# Patient Record
Sex: Male | Born: 1985 | Race: Black or African American | Hispanic: No | Marital: Single | Smoking: Current every day smoker
Health system: Southern US, Community
[De-identification: ages and names within clinical notes are randomized; demographics above are authoritative.]

## PROBLEM LIST (undated history)

## (undated) DIAGNOSIS — R7303 Prediabetes: Secondary | ICD-10-CM

## (undated) DIAGNOSIS — F1910 Other psychoactive substance abuse, uncomplicated: Secondary | ICD-10-CM

## (undated) DIAGNOSIS — F329 Major depressive disorder, single episode, unspecified: Secondary | ICD-10-CM

## (undated) DIAGNOSIS — T1491XA Suicide attempt, initial encounter: Secondary | ICD-10-CM

## (undated) DIAGNOSIS — F1490 Cocaine use, unspecified, uncomplicated: Secondary | ICD-10-CM

## (undated) DIAGNOSIS — F1190 Opioid use, unspecified, uncomplicated: Secondary | ICD-10-CM

## (undated) DIAGNOSIS — F32A Depression, unspecified: Secondary | ICD-10-CM

## (undated) HISTORY — PX: OTHER SURGICAL HISTORY: SHX169

---

## 2010-06-01 ENCOUNTER — Emergency Department (HOSPITAL_COMMUNITY)
Admission: EM | Admit: 2010-06-01 | Discharge: 2010-06-01 | Disposition: A | Discharge status: Home / Self Care | Payer: Self-pay | Attending: Emergency Medicine | Admitting: Emergency Medicine

## 2010-06-01 ENCOUNTER — Emergency Department (HOSPITAL_COMMUNITY): Payer: Self-pay

## 2010-06-01 DIAGNOSIS — R296 Repeated falls: Secondary | ICD-10-CM | POA: Insufficient documentation

## 2010-06-01 DIAGNOSIS — Y92009 Unspecified place in unspecified non-institutional (private) residence as the place of occurrence of the external cause: Secondary | ICD-10-CM | POA: Insufficient documentation

## 2010-06-01 DIAGNOSIS — S62109A Fracture of unspecified carpal bone, unspecified wrist, initial encounter for closed fracture: Secondary | ICD-10-CM | POA: Insufficient documentation

## 2010-06-01 DIAGNOSIS — M7989 Other specified soft tissue disorders: Secondary | ICD-10-CM | POA: Insufficient documentation

## 2010-06-01 DIAGNOSIS — M25539 Pain in unspecified wrist: Secondary | ICD-10-CM | POA: Insufficient documentation

## 2010-11-09 ENCOUNTER — Emergency Department (HOSPITAL_COMMUNITY)
Admission: EM | Admit: 2010-11-09 | Discharge: 2010-11-09 | Disposition: A | Discharge status: Home / Self Care | Payer: Self-pay | Attending: Emergency Medicine | Admitting: Emergency Medicine

## 2010-11-09 DIAGNOSIS — M25539 Pain in unspecified wrist: Secondary | ICD-10-CM | POA: Insufficient documentation

## 2010-11-09 DIAGNOSIS — R51 Headache: Secondary | ICD-10-CM | POA: Insufficient documentation

## 2012-01-19 IMAGING — CR DG HAND COMPLETE 3+V*L*
3 series · 3 of 3 positions shown · non-contrast
Comparison: None.

CLINICAL DATA: Fall.  Lateral wrist and hand pain.

LEFT HAND - COMPLETE 3+ VIEW

[x hand pa left]
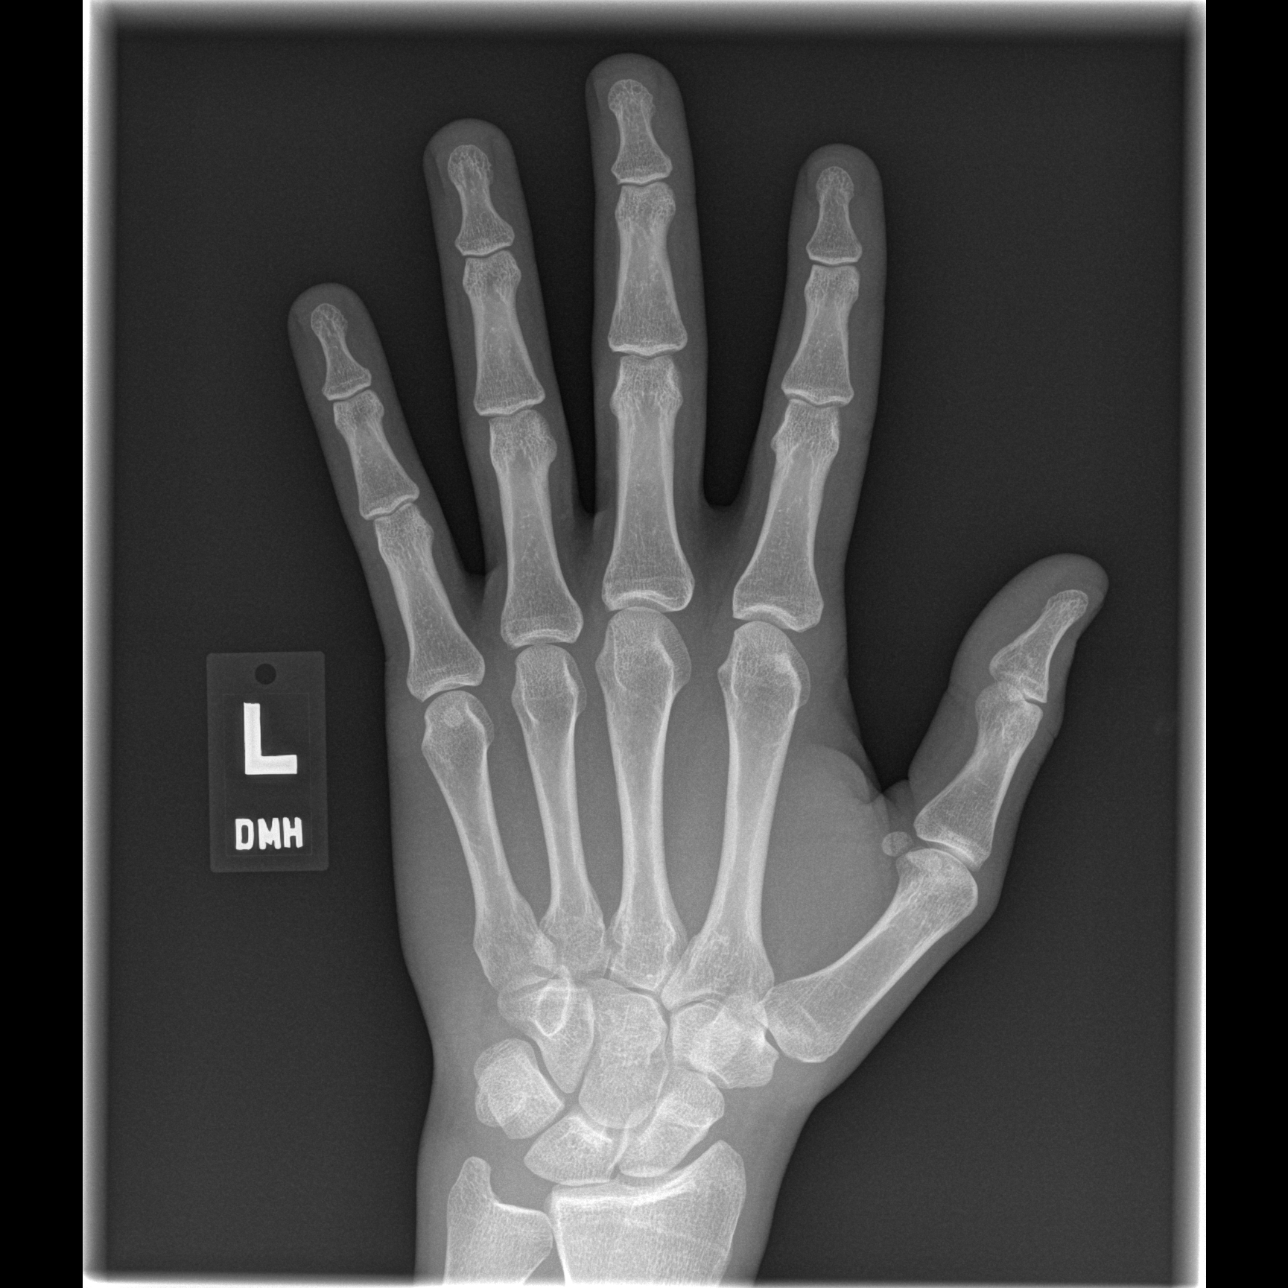

[x hand oblique left]
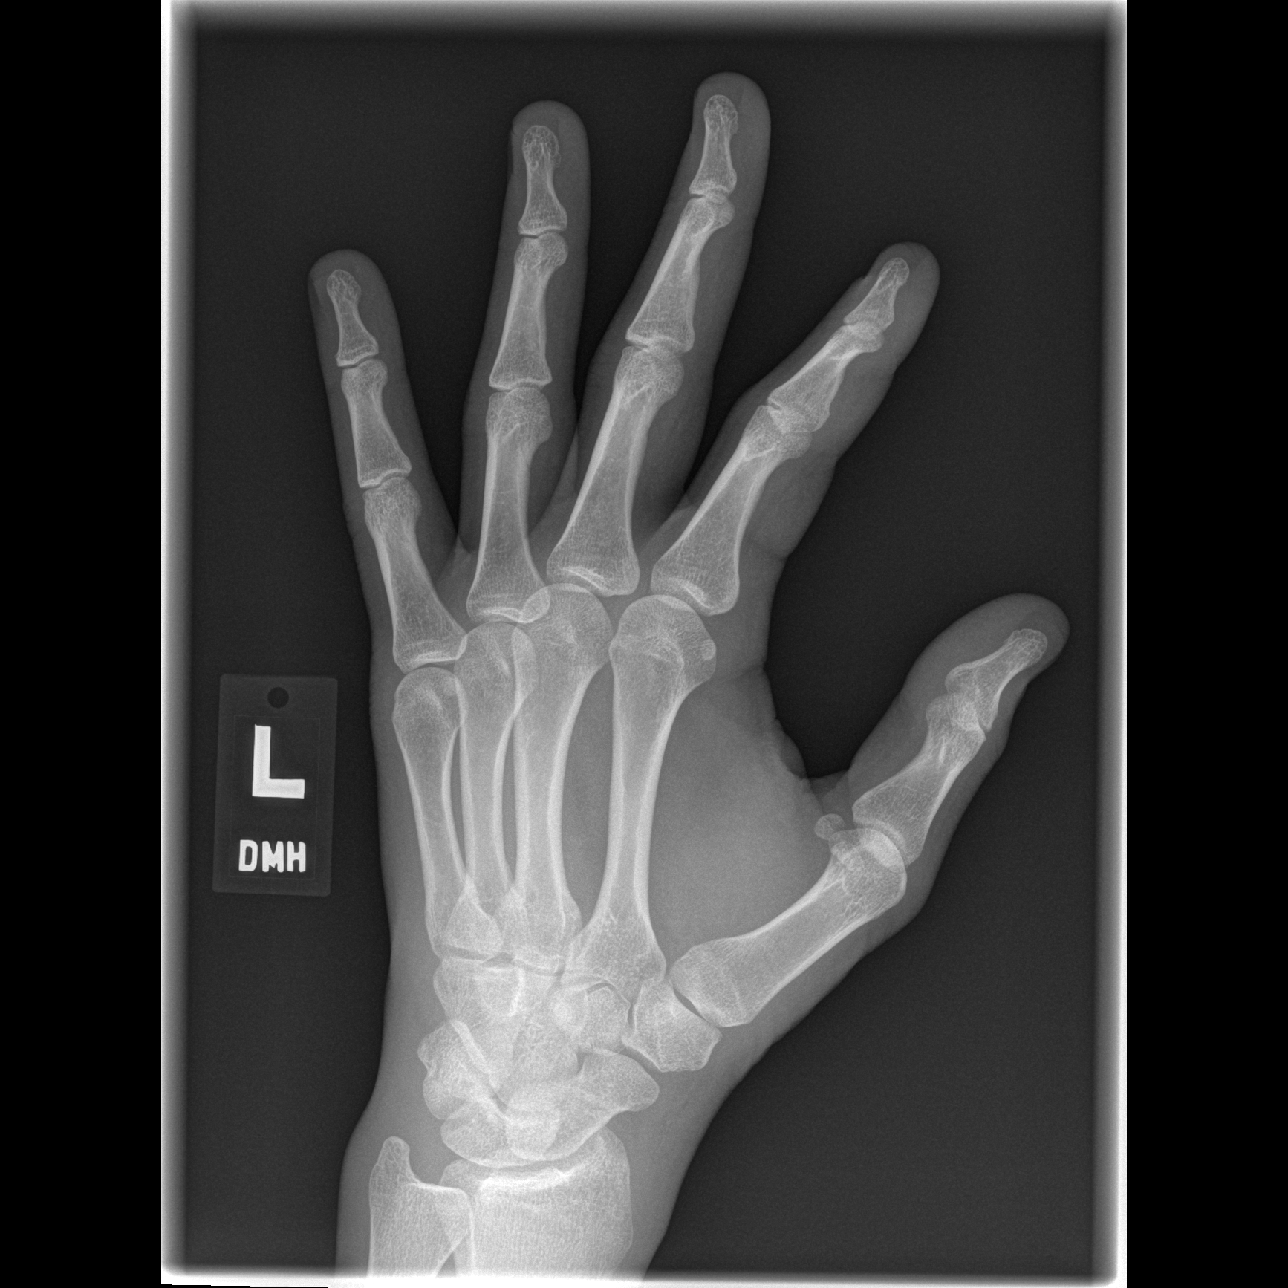

[x hand lat left]
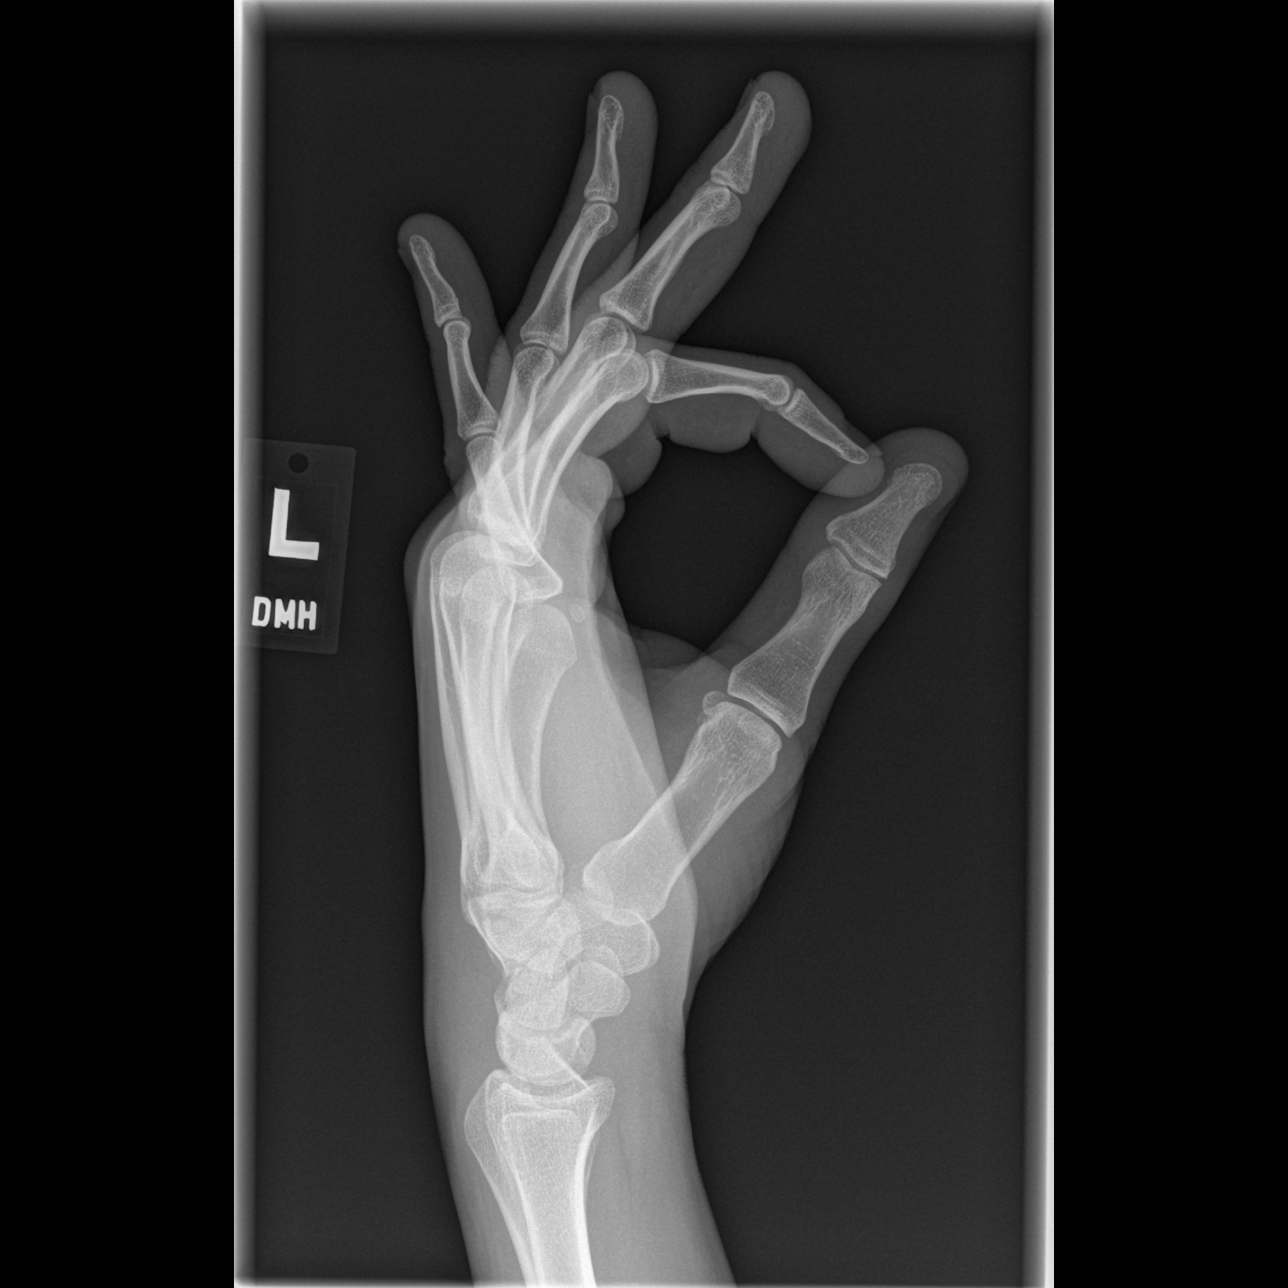

[3 of 3 positions shown; findings below may reference images not displayed]

FINDINGS: There is no evidence of fracture or dislocation.  There
is no evidence of arthropathy or other focal bone abnormality.
Soft tissues are unremarkable.
IMPRESSION: Negative.

## 2012-12-09 ENCOUNTER — Encounter (HOSPITAL_COMMUNITY): Payer: Self-pay | Admitting: Emergency Medicine

## 2012-12-09 ENCOUNTER — Emergency Department (HOSPITAL_COMMUNITY)
Admission: EM | Admit: 2012-12-09 | Discharge: 2012-12-10 | Disposition: A | Discharge status: Home / Self Care | Payer: No Typology Code available for payment source | Attending: Emergency Medicine | Admitting: Emergency Medicine

## 2012-12-09 DIAGNOSIS — F17200 Nicotine dependence, unspecified, uncomplicated: Secondary | ICD-10-CM | POA: Insufficient documentation

## 2012-12-09 DIAGNOSIS — T148XXA Other injury of unspecified body region, initial encounter: Secondary | ICD-10-CM

## 2012-12-09 DIAGNOSIS — S335XXA Sprain of ligaments of lumbar spine, initial encounter: Secondary | ICD-10-CM | POA: Insufficient documentation

## 2012-12-09 DIAGNOSIS — Y92410 Unspecified street and highway as the place of occurrence of the external cause: Secondary | ICD-10-CM | POA: Insufficient documentation

## 2012-12-09 DIAGNOSIS — Y9389 Activity, other specified: Secondary | ICD-10-CM | POA: Insufficient documentation

## 2012-12-09 DIAGNOSIS — S139XXA Sprain of joints and ligaments of unspecified parts of neck, initial encounter: Secondary | ICD-10-CM | POA: Insufficient documentation
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
## 2012-12-10 ENCOUNTER — Emergency Department (HOSPITAL_COMMUNITY): Payer: No Typology Code available for payment source

## 2012-12-10 MED ORDER — HYDROCODONE-ACETAMINOPHEN 5-325 MG PO TABS
1.0000 | ORAL_TABLET | Freq: Once | ORAL | Status: AC
  Administered 2012-12-10: 1 via ORAL
  Filled 2012-12-10: qty 1

## 2012-12-10 MED ORDER — NAPROXEN 500 MG PO TABS: 500.0000 mg | ORAL_TABLET | Freq: Two times a day (BID) | ORAL | Status: DC

## 2012-12-10 MED ORDER — CYCLOBENZAPRINE HCL 10 MG PO TABS: 10.0000 mg | ORAL_TABLET | Freq: Three times a day (TID) | ORAL | Status: DC | PRN
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
## 2012-12-10 NOTE — ED Provider Notes (Signed)
Medical screening examination/treatment/procedure(s) were performed by non-physician practitioner and as supervising physician I was immediately available for consultation/collaboration.  Inri Sobieski M Alazay Leicht, MD 12/10/12 0446 

## 2012-12-10 NOTE — ED Notes (Signed)
Pt denies any questions upon discharge, report decrease pain from original pain level.

## 2015-06-05 ENCOUNTER — Inpatient Hospital Stay (HOSPITAL_COMMUNITY)
Admission: EM | Admit: 2015-06-05 | Discharge: 2015-06-08 | DRG: 917 | Disposition: A | Discharge status: Home / Self Care | Payer: Self-pay | Attending: Internal Medicine | Admitting: Internal Medicine

## 2015-06-05 ENCOUNTER — Emergency Department (HOSPITAL_COMMUNITY): Payer: Self-pay

## 2015-06-05 ENCOUNTER — Encounter (HOSPITAL_COMMUNITY): Payer: Self-pay

## 2015-06-05 DIAGNOSIS — F17200 Nicotine dependence, unspecified, uncomplicated: Secondary | ICD-10-CM | POA: Diagnosis present

## 2015-06-05 DIAGNOSIS — Z6821 Body mass index (BMI) 21.0-21.9, adult: Secondary | ICD-10-CM

## 2015-06-05 DIAGNOSIS — Z8249 Family history of ischemic heart disease and other diseases of the circulatory system: Secondary | ICD-10-CM

## 2015-06-05 DIAGNOSIS — Z791 Long term (current) use of non-steroidal anti-inflammatories (NSAID): Secondary | ICD-10-CM

## 2015-06-05 DIAGNOSIS — T50901A Poisoning by unspecified drugs, medicaments and biological substances, accidental (unintentional), initial encounter: Secondary | ICD-10-CM | POA: Diagnosis present

## 2015-06-05 DIAGNOSIS — E1165 Type 2 diabetes mellitus with hyperglycemia: Secondary | ICD-10-CM | POA: Diagnosis present

## 2015-06-05 DIAGNOSIS — E872 Acidosis: Secondary | ICD-10-CM | POA: Diagnosis present

## 2015-06-05 DIAGNOSIS — J690 Pneumonitis due to inhalation of food and vomit: Secondary | ICD-10-CM | POA: Diagnosis present

## 2015-06-05 DIAGNOSIS — E860 Dehydration: Secondary | ICD-10-CM | POA: Diagnosis present

## 2015-06-05 DIAGNOSIS — F1910 Other psychoactive substance abuse, uncomplicated: Secondary | ICD-10-CM | POA: Diagnosis present

## 2015-06-05 DIAGNOSIS — R7303 Prediabetes: Secondary | ICD-10-CM

## 2015-06-05 DIAGNOSIS — T402X1A Poisoning by other opioids, accidental (unintentional), initial encounter: Principal | ICD-10-CM | POA: Diagnosis present

## 2015-06-05 DIAGNOSIS — J9601 Acute respiratory failure with hypoxia: Secondary | ICD-10-CM | POA: Diagnosis present

## 2015-06-05 DIAGNOSIS — N179 Acute kidney failure, unspecified: Secondary | ICD-10-CM | POA: Diagnosis present

## 2015-06-05 LAB — BLOOD GAS, ARTERIAL
Acid-base deficit: 5.7 mmol/L — ABNORMAL HIGH (ref 0.0–2.0)
BICARBONATE: 21.3 meq/L (ref 20.0–24.0)
Drawn by: 252031
FIO2: 1
O2 Saturation: 89.6 %
PO2 ART: 63.8 mmHg — AB (ref 80.0–100.0)
Patient temperature: 98.6
TCO2: 19.9 mmol/L (ref 0–100)
pCO2 arterial: 50.2 mmHg — ABNORMAL HIGH (ref 35.0–45.0)
pH, Arterial: 7.251 — ABNORMAL LOW (ref 7.350–7.450)

## 2015-06-05 LAB — CBC WITH DIFFERENTIAL/PLATELET
Basophils Absolute: 0 10*3/uL (ref 0.0–0.1)
Basophils Relative: 0 %
EOS PCT: 1 %
Eosinophils Absolute: 0 10*3/uL (ref 0.0–0.7)
HCT: 38.9 % — ABNORMAL LOW (ref 39.0–52.0)
Hemoglobin: 13.4 g/dL (ref 13.0–17.0)
LYMPHS ABS: 0.7 10*3/uL (ref 0.7–4.0)
LYMPHS PCT: 28 %
MCH: 32.4 pg (ref 26.0–34.0)
MCHC: 34.4 g/dL (ref 30.0–36.0)
MCV: 94 fL (ref 78.0–100.0)
MONO ABS: 0.3 10*3/uL (ref 0.1–1.0)
Monocytes Relative: 11 %
Neutro Abs: 1.5 10*3/uL — ABNORMAL LOW (ref 1.7–7.7)
Neutrophils Relative %: 60 %
PLATELETS: 158 10*3/uL (ref 150–400)
RBC: 4.14 MIL/uL — AB (ref 4.22–5.81)
RDW: 12.1 % (ref 11.5–15.5)
WBC: 2.5 10*3/uL — ABNORMAL LOW (ref 4.0–10.5)

## 2015-06-05 LAB — I-STAT CG4 LACTIC ACID, ED: Lactic Acid, Venous: 2.17 mmol/L (ref 0.5–2.0)

## 2015-06-05 LAB — COMPREHENSIVE METABOLIC PANEL
ALT: 31 U/L (ref 17–63)
AST: 46 U/L — ABNORMAL HIGH (ref 15–41)
Albumin: 3.8 g/dL (ref 3.5–5.0)
Alkaline Phosphatase: 81 U/L (ref 38–126)
Anion gap: 17 — ABNORMAL HIGH (ref 5–15)
BUN: 26 mg/dL — ABNORMAL HIGH (ref 6–20)
CHLORIDE: 109 mmol/L (ref 101–111)
CO2: 14 mmol/L — ABNORMAL LOW (ref 22–32)
CREATININE: 1.79 mg/dL — AB (ref 0.61–1.24)
Calcium: 9.2 mg/dL (ref 8.9–10.3)
GFR, EST AFRICAN AMERICAN: 57 mL/min — AB (ref >60)
GFR, EST NON AFRICAN AMERICAN: 49 mL/min — AB (ref >60)
Glucose, Bld: 219 mg/dL — ABNORMAL HIGH (ref 65–99)
POTASSIUM: 3.6 mmol/L (ref 3.5–5.1)
Sodium: 140 mmol/L (ref 135–145)
Total Bilirubin: 0.5 mg/dL (ref 0.3–1.2)
Total Protein: 6.4 g/dL — ABNORMAL LOW (ref 6.5–8.1)

## 2015-06-05 LAB — RAPID URINE DRUG SCREEN, HOSP PERFORMED
AMPHETAMINES: NOT DETECTED
BARBITURATES: NOT DETECTED
BENZODIAZEPINES: NOT DETECTED
COCAINE: NOT DETECTED
Opiates: POSITIVE — AB
TETRAHYDROCANNABINOL: POSITIVE — AB

## 2015-06-05 LAB — ACETAMINOPHEN LEVEL

## 2015-06-05 LAB — SALICYLATE LEVEL

## 2015-06-05 MED ORDER — SODIUM CHLORIDE 0.9 % IV SOLN
INTRAVENOUS | Status: DC
  Administered 2015-06-05: 23:00:00 via INTRAVENOUS

## 2015-06-05 MED ORDER — IPRATROPIUM BROMIDE 0.02 % IN SOLN
0.5000 mg | Freq: Once | RESPIRATORY_TRACT | Status: AC
  Administered 2015-06-05: 0.5 mg via RESPIRATORY_TRACT
  Filled 2015-06-05: qty 2.5

## 2015-06-05 MED ORDER — SODIUM CHLORIDE 0.9 % IV SOLN
3.0000 g | Freq: Once | INTRAVENOUS | Status: AC
  Administered 2015-06-05: 3 g via INTRAVENOUS
  Filled 2015-06-05: qty 3

## 2015-06-05 MED ORDER — SODIUM CHLORIDE 0.9 % IV BOLUS (SEPSIS)
3000.0000 mL | Freq: Once | INTRAVENOUS | Status: AC
  Administered 2015-06-05: 3000 mL via INTRAVENOUS

## 2015-06-05 MED ORDER — SODIUM CHLORIDE 0.9 % IV SOLN
INTRAVENOUS | Status: DC
  Administered 2015-06-05: 22:00:00 via INTRAVENOUS

## 2015-06-05 MED ORDER — ALBUTEROL SULFATE (2.5 MG/3ML) 0.083% IN NEBU
5.0000 mg | INHALATION_SOLUTION | Freq: Once | RESPIRATORY_TRACT | Status: AC
  Administered 2015-06-05: 5 mg via RESPIRATORY_TRACT
  Filled 2015-06-05: qty 6

## 2015-06-05 NOTE — ED Notes (Signed)
Pt mother and adopted mother at the bedside and updated on the same. Pt mother with concerns the pt has been snorting cocaine and heroin.

## 2015-06-05 NOTE — ED Notes (Signed)
Notified EDP,Knott,MD., pt. i-stat CBG Lactic acid 2.17 and RN,Kelly made aware.

## 2015-06-05 NOTE — ED Notes (Signed)
Patient transported by [facility] from home.  Patient was "snorting something" and was last seen normal today at 1400.  Patient found by family with snoring respirations at 1700 today.  Patient was given Narcan  intranasal upon fire dept arrival.  Patient was given Narcan  IM by EMS.  Patient awake, uncooperative upon arrival to ED.

## 2015-06-05 NOTE — ED Notes (Signed)
Pt.CBG 297. RN,Kelly made aware and EDP,Allen,MD.

## 2015-06-05 NOTE — Progress Notes (Signed)
Per MD, hold off on BiPAP at this time.  Pt calming down and answering more appropriately.  HHN treatment given for wheezing.  Pt to be monitored closely and placed on BiPAP if needed.  RN aware.  RT will continue to monitor as needed.

## 2015-06-05 NOTE — ED Notes (Signed)
Pt. Wiggled and squirmed until IV came out. RN,Ericka made aware.
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
## 2015-06-06 ENCOUNTER — Inpatient Hospital Stay (HOSPITAL_COMMUNITY): Payer: Self-pay

## 2015-06-06 ENCOUNTER — Encounter (HOSPITAL_COMMUNITY): Payer: Self-pay | Admitting: Internal Medicine

## 2015-06-06 DIAGNOSIS — E1129 Type 2 diabetes mellitus with other diabetic kidney complication: Secondary | ICD-10-CM

## 2015-06-06 DIAGNOSIS — N179 Acute kidney failure, unspecified: Secondary | ICD-10-CM | POA: Diagnosis present

## 2015-06-06 DIAGNOSIS — R7303 Prediabetes: Secondary | ICD-10-CM | POA: Diagnosis present

## 2015-06-06 DIAGNOSIS — F1910 Other psychoactive substance abuse, uncomplicated: Secondary | ICD-10-CM | POA: Diagnosis present

## 2015-06-06 DIAGNOSIS — T50901A Poisoning by unspecified drugs, medicaments and biological substances, accidental (unintentional), initial encounter: Secondary | ICD-10-CM | POA: Diagnosis present

## 2015-06-06 DIAGNOSIS — T402X1A Poisoning by other opioids, accidental (unintentional), initial encounter: Secondary | ICD-10-CM | POA: Diagnosis present

## 2015-06-06 LAB — COMPREHENSIVE METABOLIC PANEL
ALK PHOS: 44 U/L (ref 38–126)
ALT: 33 U/L (ref 17–63)
AST: 68 U/L — AB (ref 15–41)
Albumin: 3.2 g/dL — ABNORMAL LOW (ref 3.5–5.0)
Anion gap: 24 — ABNORMAL HIGH (ref 5–15)
BILIRUBIN TOTAL: 0.5 mg/dL (ref 0.3–1.2)
BUN: 19 mg/dL (ref 6–20)
CALCIUM: 7 mg/dL — AB (ref 8.9–10.3)
CHLORIDE: 96 mmol/L — AB (ref 101–111)
CO2: 19 mmol/L — ABNORMAL LOW (ref 22–32)
CREATININE: 1.19 mg/dL (ref 0.61–1.24)
Glucose, Bld: 136 mg/dL — ABNORMAL HIGH (ref 65–99)
Potassium: 3.6 mmol/L (ref 3.5–5.1)
Sodium: 139 mmol/L (ref 135–145)
Total Protein: 5.7 g/dL — ABNORMAL LOW (ref 6.5–8.1)

## 2015-06-06 LAB — CBG MONITORING, ED
Glucose-Capillary: 111 mg/dL — ABNORMAL HIGH (ref 65–99)
Glucose-Capillary: 94 mg/dL (ref 65–99)
Glucose-Capillary: 97 mg/dL (ref 65–99)

## 2015-06-06 LAB — CBC WITH DIFFERENTIAL/PLATELET
BASOS PCT: 0 %
Basophils Absolute: 0 10*3/uL (ref 0.0–0.1)
EOS ABS: 0 10*3/uL (ref 0.0–0.7)
EOS PCT: 0 %
HCT: 37 % — ABNORMAL LOW (ref 39.0–52.0)
Hemoglobin: 12.6 g/dL — ABNORMAL LOW (ref 13.0–17.0)
LYMPHS ABS: 0.7 10*3/uL (ref 0.7–4.0)
Lymphocytes Relative: 16 %
MCH: 32.1 pg (ref 26.0–34.0)
MCHC: 34.1 g/dL (ref 30.0–36.0)
MCV: 94.1 fL (ref 78.0–100.0)
Monocytes Absolute: 0.3 10*3/uL (ref 0.1–1.0)
Monocytes Relative: 8 %
NEUTROS PCT: 76 %
Neutro Abs: 3.3 10*3/uL (ref 1.7–7.7)
PLATELETS: 127 10*3/uL — AB (ref 150–400)
RBC: 3.93 MIL/uL — AB (ref 4.22–5.81)
RDW: 12.2 % (ref 11.5–15.5)
WBC: 4.4 10*3/uL (ref 4.0–10.5)

## 2015-06-06 LAB — STREP PNEUMONIAE URINARY ANTIGEN: STREP PNEUMO URINARY ANTIGEN: NEGATIVE

## 2015-06-06 LAB — GLUCOSE, CAPILLARY
GLUCOSE-CAPILLARY: 100 mg/dL — AB (ref 65–99)
GLUCOSE-CAPILLARY: 95 mg/dL (ref 65–99)
GLUCOSE-CAPILLARY: 99 mg/dL (ref 65–99)

## 2015-06-06 LAB — SODIUM, URINE, RANDOM: Sodium, Ur: 39 mmol/L

## 2015-06-06 LAB — HIV ANTIBODY (ROUTINE TESTING W REFLEX): HIV SCREEN 4TH GENERATION: NONREACTIVE

## 2015-06-06 LAB — BRAIN NATRIURETIC PEPTIDE: B Natriuretic Peptide: 59 pg/mL (ref 0.0–100.0)

## 2015-06-06 LAB — CREATININE, URINE, RANDOM: CREATININE, URINE: 219.4 mg/dL

## 2015-06-06 MED ORDER — INSULIN ASPART 100 UNIT/ML ~~LOC~~ SOLN: 0.0000 [IU] | SUBCUTANEOUS | Status: DC

## 2015-06-06 MED ORDER — SODIUM CHLORIDE 0.9 % IV SOLN: INTRAVENOUS | Status: DC

## 2015-06-06 MED ORDER — THIAMINE HCL 100 MG/ML IJ SOLN
Freq: Once | INTRAVENOUS | Status: AC
  Administered 2015-06-06: 04:00:00 via INTRAVENOUS
  Filled 2015-06-06: qty 1000

## 2015-06-06 MED ORDER — PIPERACILLIN-TAZOBACTAM 3.375 G IVPB
3.3750 g | Freq: Three times a day (TID) | INTRAVENOUS | Status: DC
  Administered 2015-06-06: 3.375 g via INTRAVENOUS
  Filled 2015-06-06: qty 50

## 2015-06-06 MED ORDER — DICYCLOMINE HCL 20 MG PO TABS: 20.0000 mg | ORAL_TABLET | Freq: Four times a day (QID) | ORAL | Status: DC | PRN

## 2015-06-06 MED ORDER — ALBUTEROL SULFATE (2.5 MG/3ML) 0.083% IN NEBU
5.0000 mg | INHALATION_SOLUTION | RESPIRATORY_TRACT | Status: DC | PRN
  Administered 2015-06-06: 5 mg via RESPIRATORY_TRACT
  Filled 2015-06-06: qty 6

## 2015-06-06 MED ORDER — SODIUM CHLORIDE 0.9 % IV SOLN
3.0000 g | Freq: Four times a day (QID) | INTRAVENOUS | Status: DC
  Administered 2015-06-06 – 2015-06-08 (×9): 3 g via INTRAVENOUS
  Filled 2015-06-06 (×9): qty 3

## 2015-06-06 MED ORDER — KCL IN DEXTROSE-NACL 20-5-0.45 MEQ/L-%-% IV SOLN: INTRAVENOUS | Status: DC

## 2015-06-06 MED ORDER — NALOXONE HCL 0.4 MG/ML IJ SOLN: 0.4000 mg | INTRAMUSCULAR | Status: DC | PRN

## 2015-06-06 MED ORDER — LORAZEPAM 2 MG/ML IJ SOLN: 2.0000 mg | INTRAMUSCULAR | Status: DC | PRN

## 2015-06-06 MED ORDER — IPRATROPIUM BROMIDE 0.02 % IN SOLN
0.5000 mg | Freq: Four times a day (QID) | RESPIRATORY_TRACT | Status: DC
  Administered 2015-06-06: 0.5 mg via RESPIRATORY_TRACT
  Filled 2015-06-06: qty 2.5

## 2015-06-06 MED ORDER — NAPROXEN 500 MG PO TABS
500.0000 mg | ORAL_TABLET | Freq: Two times a day (BID) | ORAL | Status: DC | PRN
  Filled 2015-06-06: qty 1

## 2015-06-06 MED ORDER — LOPERAMIDE HCL 2 MG PO CAPS: 2.0000 mg | ORAL_CAPSULE | ORAL | Status: DC | PRN

## 2015-06-06 MED ORDER — METHOCARBAMOL 500 MG PO TABS: 500.0000 mg | ORAL_TABLET | Freq: Three times a day (TID) | ORAL | Status: DC | PRN

## 2015-06-06 MED ORDER — ONDANSETRON 4 MG PO TBDP: 4.0000 mg | ORAL_TABLET | Freq: Four times a day (QID) | ORAL | Status: DC | PRN

## 2015-06-06 MED ORDER — IPRATROPIUM BROMIDE 0.02 % IN SOLN: 0.5000 mg | Freq: Four times a day (QID) | RESPIRATORY_TRACT | Status: DC | PRN

## 2015-06-06 MED ORDER — ENOXAPARIN SODIUM 40 MG/0.4ML ~~LOC~~ SOLN
40.0000 mg | SUBCUTANEOUS | Status: DC
  Administered 2015-06-06 – 2015-06-08 (×3): 40 mg via SUBCUTANEOUS
  Filled 2015-06-06 (×3): qty 0.4

## 2015-06-06 MED ORDER — HYDROXYZINE HCL 25 MG PO TABS: 25.0000 mg | ORAL_TABLET | Freq: Four times a day (QID) | ORAL | Status: DC | PRN

## 2015-06-06 NOTE — Progress Notes (Signed)
Pharmacy Antibiotic Note  Geoffrey Flowers is a 30 y.o. male admitted on 06/05/2015 with aspiration PNA.Marland Kitchen  Pharmacy has been consulted for Unasyn dosing.  Plan: Pt received a one time dose of Unasyn 3 gr. Will start Unasyn 3 gr IV q6h  Height:  (177.8 cm) Weight: 150 lb (68.04 kg) IBW/kg (Calculated) : 73  Temp (24hrs), Avg:98.1 F (36.7 C), Min:97.2 F (36.2 C), Max:98.7 F (37.1 C)   Recent Labs Lab 06/05/15 2123 06/05/15 2321 06/05/15 2337 06/06/15 0424  WBC  --  2.5*  --  4.4  CREATININE 1.79*  --   --  1.19  LATICACIDVEN  --   --  2.17*  --     Estimated Creatinine Clearance: 87.3 mL/min (by C-G formula based on Cr of 1.19).    No Known Allergies  Antimicrobials this admission: Unasyn 2/10 >>  Zosyn 2/11 >> 2/11  Dose adjustments this admission:   Microbiology results: None  Thank you for allowing pharmacy to be a part of this patient's care.   Adalberto Cole, PharmD, BCPS Pager [phone] 06/06/2015 10:27 AM

## 2015-06-06 NOTE — ED Notes (Signed)
Pt arouseable and answering questions.  Pt disoriented as to events last night.  Explained plan to patient.
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
## 2015-06-06 NOTE — ED Notes (Signed)
Dr. Freida Busman notified of pt current vitals, aware pt is now on a non rebreather at this time. Still remains agitated, pulling at lines and monitor, frequent reorientation. Pt will not follow commands, or answer questions, only ask for water. Xray notified to please complete cxray at this time.

## 2015-06-06 NOTE — ED Notes (Signed)
Admitting md at bedside for assessment

## 2015-06-06 NOTE — H&P (Signed)
PCP: None   Referring provider Freida Busman   Chief Complaint: Brought in by EMS opiate overdose  HPI: Geoffrey Flowers is a 30 y.o. male   has no past medical history on file.   Presented from home patient was found by family with snoring or respirations and pinpoint pupils at 5 PM he was last seen at 2 PM snorting something. His blood sugar on arrival was noted to be 400 Patient was given intranasal Narcan 2 mg by fire department and then 2 mg IM by EMS patient woke up but became belligerent and combative.  IN ER: On arrival CBG noted to be elevated at 296. Chest x-ray showed pulmonary edema but ER provider felt a small likely aspiration pneumonia and antibiotic coverage was started. ABG was obtained showing pH of 7.251/15.2/63.8 Was initially currently patient is much more alert. Creatinine was noted to be elevated at 1.79 and he was given IV fluids for presumed acute renal failure patient was found to have some wheezing and started on a beautiful and Atrovent in emergency department. Urine tox positive for marijuana and opiates   Hospitalist was called for admission for presumed aspiration pneumonia in the setting of heroin overdose  Review of Systems:   Patient refuses to answer unable to obtain   Past Medical History: History reviewed. No pertinent past medical history. History reviewed. No pertinent past surgical history.   Medications: Prior to Admission medications   Medication Sig Start Date End Date Taking? Authorizing Provider  cyclobenzaprine (FLEXERIL) 10 MG tablet Take 1 tablet (10 mg total) by mouth 3 (three) times daily as needed for muscle spasms. 12/10/12   Ivonne Andrew, PA-C  naproxen (NAPROSYN) 500 MG tablet Take 1 tablet (500 mg total) by mouth 2 (two) times daily. 12/10/12   Ivonne Andrew, PA-C    Allergies:  No Known Allergies  Social History:  Ambulatory  independently Lives at home With family issues and has adoptive mother as well as biological mother    Per nursing staff family reported that patient abuses drugs together his biological mother   reports that he has been smoking.  He does not have any smokeless tobacco history on file. He reports that he drinks alcohol. He reports that he uses illicit drugs (Heroin).    Family History: family history includes Hypertension in his mother.  as per patient's report 1 his parents have hypertension   Physical Exam: Patient Vitals for the past 24 hrs:  BP Temp Temp src Pulse Resp SpO2  06/05/15 2345 - - - 108 19 96 %  06/05/15 2328 - 98.7 F (37.1 C) - - - -  06/05/15 2303 - - - - - 96 %  06/05/15 2300 105/61 mmHg - - 107 (!) 30 95 %  06/05/15 2230 144/92 mmHg - - (!) 135 22 (!) 88 %  06/05/15 2200 98/85 mmHg - - 119 22 (!) 86 %  06/05/15 2154 115/90 mmHg - - 116 19 (!) 89 %  06/05/15 2140 106/70 mmHg - - (!) 142 26 -  06/05/15 2135 100/64 mmHg - - (!) 151 - -  06/05/15 2020 145/95 mmHg 97.2 F (36.2 C) Rectal (!) 145 18 100 %    1. General:  in No Acute distress 2. Psychological: Somnolent but arousable and Oriented to self 3. Head/ENT:   Dry Mucous Membranes                          Head Non  traumatic, neck supple                          Normal Dentition 4. SKIN:   decreased Skin turgor,  Skin clean Dry and intact no rash multiple tattoos present needle marks in the left AC noted 5. Heart: Regular rate and rhythm no Murmur, Rub or gallop 6. Lungs:  no wheezes some  crackles  Noted  7. Abdomen: Soft, non-tender, Non distended 8. Lower extremities: no clubbing, cyanosis, or edema 9. Neurologically Grossly intact, moving all 4 extremities equally 10. MSK: Normal range of motion  body mass index is unknown because there is no height or weight on file.   Labs on Admission:   Results for orders placed or performed during the hospital encounter of 06/05/15 (from the past 24 hour(s))  Comprehensive metabolic panel     Status: Abnormal   Collection Time: 06/05/15  9:23 PM  Result  Value Ref Range   Sodium 140 135 - 145 mmol/L   Potassium 3.6 3.5 - 5.1 mmol/L   Chloride 109 101 - 111 mmol/L   CO2 14 (L) 22 - 32 mmol/L   Glucose, Bld 219 (H) 65 - 99 mg/dL   BUN 26 (H) 6 - 20 mg/dL   Creatinine, Ser 1.61 (H) 0.61 - 1.24 mg/dL   Calcium 9.2 8.9 - 09.6 mg/dL   Total Protein 6.4 (L) 6.5 - 8.1 g/dL   Albumin 3.8 3.5 - 5.0 g/dL   AST 46 (H) 15 - 41 U/L   ALT 31 17 - 63 U/L   Alkaline Phosphatase 81 38 - 126 U/L   Total Bilirubin 0.5 0.3 - 1.2 mg/dL   GFR calc non Af Amer 49 (L) >60 mL/min   GFR calc Af Amer 57 (L) >60 mL/min   Anion gap 17 (H) 5 - 15  Salicylate level     Status: None   Collection Time: 06/05/15  9:23 PM  Result Value Ref Range   Salicylate Lvl <4.0 2.8 - 30.0 mg/dL  Acetaminophen level     Status: Abnormal   Collection Time: 06/05/15  9:23 PM  Result Value Ref Range   Acetaminophen (Tylenol), Serum <10 (L) 10 - 30 ug/mL  Urine rapid drug screen (hosp performed)     Status: Abnormal   Collection Time: 06/05/15  9:33 PM  Result Value Ref Range   Opiates POSITIVE (A) NONE DETECTED   Cocaine NONE DETECTED NONE DETECTED   Benzodiazepines NONE DETECTED NONE DETECTED   Amphetamines NONE DETECTED NONE DETECTED   Tetrahydrocannabinol POSITIVE (A) NONE DETECTED   Barbiturates NONE DETECTED NONE DETECTED  Blood gas, arterial     Status: Abnormal   Collection Time: 06/05/15 10:37 PM  Result Value Ref Range   FIO2 1.00    Delivery systems OXYGEN MASK    pH, Arterial 7.251 (L) 7.350 - 7.450   pCO2 arterial 50.2 (H) 35.0 - 45.0 mmHg   pO2, Arterial 63.8 (L) 80.0 - 100.0 mmHg   Bicarbonate 21.3 20.0 - 24.0 mEq/L   TCO2 19.9 0 - 100 mmol/L   Acid-base deficit 5.7 (H) 0.0 - 2.0 mmol/L   O2 Saturation 89.6 %   Patient temperature 98.6    Collection site LEFT RADIAL    Drawn by [id number]    Sample type ARTERIAL DRAW    Allens test (pass/fail) PASS PASS  Brain natriuretic peptide     Status: None   Collection Time: 06/05/15 11:21 PM  Result Value Ref  Range   B Natriuretic Peptide 59.0 0.0 - 100.0 pg/mL  CBC with Differential/Platelet     Status: Abnormal   Collection Time: 06/05/15 11:21 PM  Result Value Ref Range   WBC 2.5 (L) 4.0 - 10.5 K/uL   RBC 4.14 (L) 4.22 - 5.81 MIL/uL   Hemoglobin 13.4 13.0 - 17.0 g/dL   HCT 16.1 (L) 09.6 - 04.5 %   MCV 94.0 78.0 - 100.0 fL   MCH 32.4 26.0 - 34.0 pg   MCHC 34.4 30.0 - 36.0 g/dL   RDW 40.9 81.1 - 91.4 %   Platelets 158 150 - 400 K/uL   Neutrophils Relative % 60 %   Neutro Abs 1.5 (L) 1.7 - 7.7 K/uL   Lymphocytes Relative 28 %   Lymphs Abs 0.7 0.7 - 4.0 K/uL   Monocytes Relative 11 %   Monocytes Absolute 0.3 0.1 - 1.0 K/uL   Eosinophils Relative 1 %   Eosinophils Absolute 0.0 0.0 - 0.7 K/uL   Basophils Relative 0 %   Basophils Absolute 0.0 0.0 - 0.1 K/uL  I-Stat CG4 Lactic Acid, ED     Status: Abnormal   Collection Time: 06/05/15 11:37 PM  Result Value Ref Range   Lactic Acid, Venous 2.17 (HH) 0.5 - 2.0 mmol/L   Comment NOTIFIED PHYSICIAN     UAnot obtained  No results found for: HGBA1C  CrCl cannot be calculated (Unknown ideal weight.).  BNP (last 3 results) No results for input(s): PROBNP in the last 8760 hours.  Other results:  I have pearsonaly reviewed this: ECG REPORT Not obtained  There were no vitals filed for this visit.   Cultures: No results found for: SDES, SPECREQUEST, CULT, REPTSTATUS   Radiological Exams on Admission: Dg Chest Port 1 View  06/05/2015  CLINICAL DATA:  Acute onset of snoring respirations. Was snorting something earlier today. Initial encounter. EXAM: PORTABLE CHEST 1 VIEW COMPARISON:  None. FINDINGS: The lungs are well-aerated. Mild right perihilar and diffuse left-sided airspace opacification raise concern for asymmetric flash pulmonary edema. Pneumonia is considered less likely. There is no evidence of pleural effusion or pneumothorax. The cardiomediastinal silhouette is within normal limits. No acute osseous abnormalities are seen.  IMPRESSION: Mild right perihilar and diffuse left-sided airspace opacification raise concern for asymmetric flash pulmonary edema. Pneumonia is considered less likely. Electronically Signed   By: Roanna Raider M.D.   On: 06/05/2015 22:10    Chart has been reviewed  Family not at  Bedside   Assessment/Plan  30 year old gentleman with history of drug abuse presents with opioid Overdose resulting in aspiration pneumonia was found to have hyperglycemia worrisome for new onset diabetes and evidence of renal insufficiency Present on Admission:  . Aspiration pneumonia (HCC) - admit to step down continue oxygen therapy discussed case with [facility] hold we'll monitor through [facility]. Currently not in the respiratory distress initial ABG was worrisome for hypercarbia but currently appears to be more awake we'll monitor carefully and step down  . Substance abuse social work consult, order HIV and hepatitis serologies  . Opioid overdose - Narcan when necessary currently does not require Narcan drip patient has been waking up Acute renal failure - will obtain urine electrolytes give gentle fluids obtain renal ultrasound Diabetes mellitus new diagnosis. Will order diabetic coordinator consult once patient is more alert we'll need to order diabetes education. Order sliding scale check hemoglobin A1c and TSH  Prophylaxis: Lovenox   CODE STATUS:  FULL CODE  Disposition:    To home once workup is complete and patient is stable  Other plan as per orders.  I have spent a total of on this admission  extra time was spent to discuss case with  [facility] Murlean Hark 06/06/2015, 12:57 AM   [facility]  Pager [phone]   after 2 AM please page floor coverage PA If 7AM-7PM, please contact the day team taking care of the patient  [email or web address]  Password [email or web address]

## 2015-06-06 NOTE — ED Notes (Signed)
Pt family contact- pt adoptive mother, lark runk, can be reached at [phone]

## 2015-06-06 NOTE — ED Notes (Signed)
Pt refusing all visitors.  This included uncle and mother Mindi Junker who have come to visit.

## 2015-06-06 NOTE — ED Notes (Signed)
Geoffrey Flowers- [phone]

## 2015-06-06 NOTE — ED Notes (Signed)
Ultrasound tech at bedside for imaging.

## 2015-06-07 DIAGNOSIS — F1910 Other psychoactive substance abuse, uncomplicated: Secondary | ICD-10-CM

## 2015-06-07 DIAGNOSIS — T50901D Poisoning by unspecified drugs, medicaments and biological substances, accidental (unintentional), subsequent encounter: Secondary | ICD-10-CM

## 2015-06-07 DIAGNOSIS — N179 Acute kidney failure, unspecified: Secondary | ICD-10-CM

## 2015-06-07 DIAGNOSIS — J690 Pneumonitis due to inhalation of food and vomit: Secondary | ICD-10-CM

## 2015-06-07 LAB — HEPATITIS PANEL, ACUTE
HCV Ab: 0.1 s/co ratio (ref 0.0–0.9)
HEP A IGM: NEGATIVE
HEP B C IGM: NEGATIVE
HEP B S AG: NEGATIVE

## 2015-06-07 LAB — GLUCOSE, CAPILLARY
GLUCOSE-CAPILLARY: 89 mg/dL (ref 65–99)
GLUCOSE-CAPILLARY: 91 mg/dL (ref 65–99)
Glucose-Capillary: 99 mg/dL (ref 65–99)
Glucose-Capillary: 96 mg/dL (ref 65–99)
Glucose-Capillary: 99 mg/dL (ref 65–99)

## 2015-06-07 MED ORDER — CHLORHEXIDINE GLUCONATE 0.12 % MT SOLN
15.0000 mL | Freq: Two times a day (BID) | OROMUCOSAL | Status: DC
  Administered 2015-06-07 (×2): 15 mL via OROMUCOSAL
  Filled 2015-06-07 (×5): qty 15

## 2015-06-07 MED ORDER — CETYLPYRIDINIUM CHLORIDE 0.05 % MT LIQD
7.0000 mL | Freq: Two times a day (BID) | OROMUCOSAL | Status: DC
  Administered 2015-06-07 – 2015-06-08 (×3): 7 mL via OROMUCOSAL

## 2015-06-07 MED ORDER — IBUPROFEN 400 MG PO TABS
400.0000 mg | ORAL_TABLET | Freq: Once | ORAL | Status: AC
  Administered 2015-06-07: 400 mg via ORAL
  Filled 2015-06-07: qty 2
  Filled 2015-06-07: qty 1

## 2015-06-07 NOTE — Progress Notes (Signed)
[facility] PROGRESS NOTE  Geoffrey Flowers ZOX:[id number] DOB: [birth year] DOA: 06/05/2015 PCP: No primary care provider on file.  Brief Summary  The patient is a 30 year old male with history of polysubstance abuse who presents with drug overdose, aspiration pneumonia, AKI.  He was found by his family with "snoring" respirations and they called 911.  He had pinpoint pupils when fire department arrived and he was given narcan  intranasal.  When EMS arrived, they gave him an additional  IM after which he started to arouse and he was transported to the ER.  CBG was 400 when EMS arrived.  In ER, ABG 7.25/50/64 on nonrebreather, UDS positive for THC and opiates, and CXR concerning for aspiration pneumonia.    Assessment/Plan  Acute hypoxic respiratory failure secondary to aspiration pneumonia  -  No risk factors for Pseudomonas so will change from zosyn to unasyn -  Transition to room air as tolerated  Opioid overdose -  Social work consultation pending -  HIV NR -  RPR pending -  Hepatitis panel neg -  Only tylenol for pain, no NSAIDS due to AKI and no more narcotics  ARF due to hypoxia and acidosis and dehydration, resolved with IVF -  RUS:  Medical renal disease, no obstruction -  D/c IVF  Hyperglycemia, may have been sugar in whatever drugs he took or new onset diabetes -  A1c pending -  CBG have been stable.  D/c     Diet:  Regular diet Access:  PIV IVF:  off Proph:  lovenox  Code Status: full Family Communication: patient alone Disposition Plan: pending improvement in mentation, breathing comfortably on room air   Consultants:  PCCM  Procedures:  CXR  Antibiotics:  Unasyn 2/11 >   HPI/Subjective:  Denies pain, SOB, but had some cough this morning.  Still on oxygen.  Tired.  Mild nausea.    Objective: Filed Vitals:   06/07/15 0842 06/07/15 1204 06/07/15 1223 06/07/15 1712  BP: 134/85  138/68 132/86  Pulse: 99  92 82  Temp: 98.5 F (36.9 C)   98.2 F (36.8 C) 98.7 F (37.1 C)  TempSrc: Oral  Oral Oral  Resp: Height:      Weight:      SpO2: 94% 93% 98% 99%    Intake/Output Summary (Last 24 hours) at 06/07/15 1731 Last data filed at 06/07/15 0843  Gross per 24 hour  Intake      0 ml  Output   1600 ml  Net  -1600 ml   Filed Weights   06/06/15 0750 06/06/15 1659  Weight: 68.04 kg (150 lb) 68.493 kg (151 lb)   Body mass index is 21.67 kg/(m^2).  Exam:   General:  Adult male, No acute distress, more alert today  HEENT:  NCAT, MMM  Cardiovascular:  RRR, nl S1, S2 no mrg, 2+ pulses, warm extremities  Respiratory:  Diminished bilateral BS, no rales, rhonchi, or wheeze, no increased WOB  Abdomen:   NABS, soft, NT/ND  MSK:   Normal tone and bulk, no LEE  Neuro:  Grossly intact  Data Reviewed: Basic Metabolic Panel:  Recent Labs Lab 06/05/15 2123 06/06/15 0424  NA 140 139  K 3.6 3.6  CL 109 96*  CO2 14* 19*  GLUCOSE 219* 136*  BUN 26* 19  CREATININE 1.79* 1.19  CALCIUM 9.2 7.0*   Liver Function Tests:  Recent Labs Lab 06/05/15 2123 06/06/15 0424  AST 46* 68*  ALT 31 33  ALKPHOS 81 44  BILITOT 0.5 0.5  PROT 6.4* 5.7*  ALBUMIN 3.8 3.2*   No results for input(s): LIPASE, AMYLASE in the last 168 hours. No results for input(s): AMMONIA in the last 168 hours. CBC:  Recent Labs Lab 06/05/15 2321 06/06/15 0424  WBC 2.5* 4.4  NEUTROABS 1.5* 3.3  HGB 13.4 12.6*  HCT 38.9* 37.0*  MCV 94.0 94.1  PLT 158 127*    No results found for this or any previous visit (from the past 240 hour(s)).   Studies: US Renal  06/06/2015  CLINICAL DATA:  Acute onset of renal failure.  Initial encounter. EXAM: RENAL / URINARY TRACT ULTRASOUND COMPLETE COMPARISON:  None. FINDINGS: Right Kidney: Length: 10.4 cm. Mildly increased parenchymal echogenicity suggested. No mass or hydronephrosis visualized. Left Kidney: Length: 10.9 cm. Mildly increased parenchymal echogenicity suggested. No mass or  hydronephrosis visualized. Bladder: Appears normal for degree of bladder distention. IMPRESSION: Suggestion of mildly increased bilateral renal parenchymal echogenicity, which may reflect medical renal disease. No evidence of hydronephrosis. Electronically Signed   By: Roanna Raider M.D.   On: 06/06/2015 04:47   Dg Chest Port 1 View  06/05/2015  CLINICAL DATA:  Acute onset of snoring respirations. Was snorting something earlier today. Initial encounter. EXAM: PORTABLE CHEST 1 VIEW COMPARISON:  None. FINDINGS: The lungs are well-aerated. Mild right perihilar and diffuse left-sided airspace opacification raise concern for asymmetric flash pulmonary edema. Pneumonia is considered less likely. There is no evidence of pleural effusion or pneumothorax. The cardiomediastinal silhouette is within normal limits. No acute osseous abnormalities are seen. IMPRESSION: Mild right perihilar and diffuse left-sided airspace opacification raise concern for asymmetric flash pulmonary edema. Pneumonia is considered less likely. Electronically Signed   By: Roanna Raider M.D.   On: 06/05/2015 22:10    Scheduled Meds: . ampicillin-sulbactam (UNASYN) IV  3 g Intravenous 4 times per day  . antiseptic oral rinse  7 mL Mouth Rinse q12n4p  . chlorhexidine  15 mL Mouth Rinse BID  . enoxaparin (LOVENOX) injection  40 mg Subcutaneous Q24H  . insulin aspart  0-9 Units Subcutaneous 6 times per day   Continuous Infusions: . sodium chloride      Active Problems:   Aspiration pneumonia (HCC)   Substance abuse   Opioid overdose   Acute renal failure (HCC)   Diabetes mellitus type 2, uncontrolled (HCC)   Drug overdose    Time spent: 30 min    Geoffrey Flowers, [facility]  [facility] Pager [phone]. If 7PM-7AM, please contact night-coverage at [email or web address], password [facility] 06/07/2015, 5:31 PM  LOS: 2 days

## 2015-06-08 DIAGNOSIS — R7303 Prediabetes: Secondary | ICD-10-CM

## 2015-06-08 DIAGNOSIS — J9601 Acute respiratory failure with hypoxia: Secondary | ICD-10-CM

## 2015-06-08 DIAGNOSIS — T402X1A Poisoning by other opioids, accidental (unintentional), initial encounter: Principal | ICD-10-CM

## 2015-06-08 LAB — GLUCOSE, CAPILLARY
GLUCOSE-CAPILLARY: 108 mg/dL — AB (ref 65–99)
GLUCOSE-CAPILLARY: 297 mg/dL — AB (ref 65–99)
GLUCOSE-CAPILLARY: 80 mg/dL (ref 65–99)
GLUCOSE-CAPILLARY: 81 mg/dL (ref 65–99)
GLUCOSE-CAPILLARY: 97 mg/dL (ref 65–99)

## 2015-06-08 LAB — HEMOGLOBIN A1C
HEMOGLOBIN A1C: 5.8 % — AB (ref 4.8–5.6)
Mean Plasma Glucose: 120 mg/dL

## 2015-06-08 LAB — RPR: RPR Ser Ql: NONREACTIVE

## 2015-06-08 MED ORDER — NALOXONE HCL 4 MG/0.1ML NA LIQD: 0.1000 mL | Freq: Once | NASAL | Status: DC | PRN

## 2015-06-08 MED ORDER — LIVING WELL WITH DIABETES BOOK
Freq: Once | Status: DC
  Filled 2015-06-08: qty 1

## 2015-06-08 MED ORDER — AMOXICILLIN-POT CLAVULANATE 875-125 MG PO TABS: 1.0000 | ORAL_TABLET | Freq: Two times a day (BID) | ORAL | Status: DC

## 2015-06-08 NOTE — Discharge Summary (Signed)
Patient d/ced home with his adopted mother

## 2015-06-08 NOTE — Progress Notes (Signed)
Utilization review completed.  

## 2015-06-08 NOTE — Discharge Summary (Signed)
Physician Discharge Summary  Geoffrey Flowers BJY:[id number] DOB: [birth year] DOA: 06/05/2015  PCP: No primary care provider on file.  Admit date: 06/05/2015 Discharge date: 06/08/2015  Recommendations for Outpatient Follow-up:  1. Given resources to maintain narcotics sobriety/abstinence 2. F/u with PCP regarding borderline diabetes  3. Rx for 1 week of antibiotics for aspiration pneumonia  Discharge Diagnoses:  Principal Problem:   Opioid overdose Active Problems:   Aspiration pneumonia (HCC)   Substance abuse   Acute renal failure (HCC)   Borderline diabetes   Drug overdose   Acute respiratory failure with hypoxia [facility])   Discharge Condition: stable, improved  Diet recommendation:  Low carbohydrate/diabetic  Wt Readings from Last 3 Encounters:  06/06/15 68.493 kg (151 lb)    History of present illness:  The patient is a 30 year old male with history of polysubstance abuse who presents with drug overdose, aspiration pneumonia, AKI. He was found by his family with "snoring" respirations and they called 911. He had pinpoint pupils when fire department arrived and he was given narcan  intranasal. When EMS arrived, they gave him an additional  IM after which he started to arouse and he was transported to the ER. CBG was 400 when EMS arrived. In ER, ABG 7.25/50/64 on nonrebreather, UDS positive for THC and opiates, and CXR concerning for aspiration pneumonia.   Hospital Course:   Acute hypoxic respiratory failure secondary to aspiration pneumonia.  He was started on unasyn and completed 3 days of antibiotics in the hospital.  He was given an additional four days of augmentin for home.  He was able to ambulate without supplemental oxygen at the time of discharge.    Opioid overdose, unintentional.  HIV negative, hepatitis panel negative. RPR is pending. He was strongly counseled to not use narcotics again in the future. He was given resources such as Narcotics Anonymous by  the social worker to help him maintain sobriety. He appears contemplative. He was also given a prescription for Narcan for home.  ARF due to hypoxia and acidosis and dehydration, resolved with IVF.  Renal ultrasound demonstrated medical renal disease without evidence of obstruction.  Hyperglycemia, likely due to stress reaction. His hemoglobin A1c was 5.8.  He will need follow-up with his primary care doctor for ongoing management of prediabetes. He was advised to eat a low carbohydrate diet and exercise.  Consultants:  PCCM  Procedures:  CXR  Antibiotics:  Unasyn 2/11 >  Discharge Exam: Filed Vitals:   06/08/15 0025 06/08/15 0519  BP: 132/89 138/83  Pulse: 68 67  Temp: 98.2 F (36.8 C) 98.6 F (37 C)  Resp: 18 18   Filed Vitals:   06/07/15 1712 06/07/15 2019 06/08/15 0025 06/08/15 0519  BP: 132/86 144/95 132/89 138/83  Pulse: 82 93 68 67  Temp: 98.7 F (37.1 C) 99.6 F (37.6 C) 98.2 F (36.8 C) 98.6 F (37 C)  TempSrc: Oral Oral Oral Oral  Resp: Height:      Weight:      SpO2: 99% 98% 94% 97%     General: Adult male, No acute distress, awake, alert  HEENT: NCAT, MMM  Cardiovascular: RRR, nl S1, S2 no mrg, 2+ pulses, warm extremities  Respiratory: Clear to auscultation bilaterally, no increased WOB  Abdomen: NABS, soft, NT/ND  MSK: Normal tone and bulk, no LEE  Neuro: Grossly intact  Discharge Instructions      Discharge Instructions    Call MD for:  difficulty breathing, headache or visual disturbances  Complete by:  As directed      Call MD for:  extreme fatigue    Complete by:  As directed      Call MD for:  hives    Complete by:  As directed      Call MD for:  persistant dizziness or light-headedness    Complete by:  As directed      Call MD for:  persistant nausea and vomiting    Complete by:  As directed      Call MD for:  severe uncontrolled pain    Complete by:  As directed      Call MD for:  temperature  >100.4    Complete by:  As directed      Diet Carb Modified    Complete by:  As directed      Increase activity slowly    Complete by:  As directed             Medication List    TAKE these medications        amoxicillin-clavulanate 875-125 MG tablet  Commonly known as:  AUGMENTIN  Take 1 tablet by mouth 2 (two) times daily.     Naloxone HCl 4 MG/0.1ML Liqd  Place 0.1 mLs into the nose once as needed (drug overdose). May repeat dose in 2 to 3 minutes if no response          The results of significant diagnostics from this hospitalization (including imaging, microbiology, ancillary and laboratory) are listed below for reference.    Significant Diagnostic Studies: US Renal  06/06/2015  CLINICAL DATA:  Acute onset of renal failure.  Initial encounter. EXAM: RENAL / URINARY TRACT ULTRASOUND COMPLETE COMPARISON:  None. FINDINGS: Right Kidney: Length: 10.4 cm. Mildly increased parenchymal echogenicity suggested. No mass or hydronephrosis visualized. Left Kidney: Length: 10.9 cm. Mildly increased parenchymal echogenicity suggested. No mass or hydronephrosis visualized. Bladder: Appears normal for degree of bladder distention. IMPRESSION: Suggestion of mildly increased bilateral renal parenchymal echogenicity, which may reflect medical renal disease. No evidence of hydronephrosis. Electronically Signed   By: Roanna Raider M.D.   On: 06/06/2015 04:47   Dg Chest Port 1 View  06/05/2015  CLINICAL DATA:  Acute onset of snoring respirations. Was snorting something earlier today. Initial encounter. EXAM: PORTABLE CHEST 1 VIEW COMPARISON:  None. FINDINGS: The lungs are well-aerated. Mild right perihilar and diffuse left-sided airspace opacification raise concern for asymmetric flash pulmonary edema. Pneumonia is considered less likely. There is no evidence of pleural effusion or pneumothorax. The cardiomediastinal silhouette is within normal limits. No acute osseous abnormalities are seen.  IMPRESSION: Mild right perihilar and diffuse left-sided airspace opacification raise concern for asymmetric flash pulmonary edema. Pneumonia is considered less likely. Electronically Signed   By: Roanna Raider M.D.   On: 06/05/2015 22:10    Microbiology: No results found for this or any previous visit (from the past 240 hour(s)).   Labs: Basic Metabolic Panel:  Recent Labs Lab 06/05/15 2123 06/06/15 0424  NA 140 139  K 3.6 3.6  CL 109 96*  CO2 14* 19*  GLUCOSE 219* 136*  BUN 26* 19  CREATININE 1.79* 1.19  CALCIUM 9.2 7.0*   Liver Function Tests:  Recent Labs Lab 06/05/15 2123 06/06/15 0424  AST 46* 68*  ALT 31 33  ALKPHOS 81 44  BILITOT 0.5 0.5  PROT 6.4* 5.7*  ALBUMIN 3.8 3.2*   No results for input(s): LIPASE, AMYLASE in the last 168 hours. No results for  input(s): AMMONIA in the last 168 hours. CBC:  Recent Labs Lab 06/05/15 2321 06/06/15 0424  WBC 2.5* 4.4  NEUTROABS 1.5* 3.3  HGB 13.4 12.6*  HCT 38.9* 37.0*  MCV 94.0 94.1  PLT 158 127*   Cardiac Enzymes: No results for input(s): CKTOTAL, CKMB, CKMBINDEX, TROPONINI in the last 168 hours. BNP: BNP (last 3 results)  Recent Labs  06/05/15 2321  BNP 59.0    ProBNP (last 3 results) No results for input(s): PROBNP in the last 8760 hours.  CBG:  Recent Labs Lab 06/07/15 2016 06/08/15 0034 06/08/15 0437 06/08/15 0516 06/08/15 0804  GLUCAP 99 97 81 108* 80    Time coordinating discharge: 35 minutes  Signed:  Brennan Karam  [facility] 06/08/2015, 11:39 AM

## 2015-06-08 NOTE — Progress Notes (Signed)
Inpatient Diabetes Program Recommendations  AACE/ADA: New Consensus Statement on Inpatient Glycemic Control (2015)  Target Ranges:  Prepandial:   less than 140 mg/dL      Peak postprandial:   less than 180 mg/dL (1-2 hours)      Critically ill patients:  140 - 180 mg/dL   Review of Glycemic Control  Diabetes history: None Outpatient Diabetes medications: None Current orders for Inpatient glycemic control: None  Results for Geoffrey Flowers, Geoffrey Flowers (MRN [id number]) as of 06/08/2015 11:14  Ref. Range 06/06/2015 04:24  Hemoglobin A1C Latest Ref Range: 4.8-5.6 % 5.8 (H)  Results for Geoffrey Flowers, Geoffrey Flowers (MRN [id number]) as of 06/08/2015 11:14  Ref. Range 06/07/2015 20:16 06/08/2015 00:34 06/08/2015 04:37 06/08/2015 05:16 06/08/2015 08:04  Glucose-Capillary Latest Ref Range: 65-99 mg/dL 99 97 81 629 (H) 80   BMWU1L indicates pre-diabetes. Discussed with pt and sending Living Well book.    Recommended pt obtain PCP to follow glycemic control. Answered questions. To be discharged today.  Thank you. Ailene Ards, RD, LDN, CDE Inpatient Diabetes Coordinator [phone]

## 2015-06-11 LAB — RPR QUALITATIVE: RPR Ser Ql: NONREACTIVE

## 2016-11-03 ENCOUNTER — Emergency Department (HOSPITAL_COMMUNITY)
Admission: EM | Admit: 2016-11-03 | Discharge: 2016-11-03 | Disposition: A | Discharge status: Home / Self Care | Payer: Self-pay | Attending: Emergency Medicine | Admitting: Emergency Medicine

## 2016-11-03 ENCOUNTER — Encounter (HOSPITAL_COMMUNITY): Payer: Self-pay | Admitting: Emergency Medicine

## 2016-11-03 DIAGNOSIS — R05 Cough: Secondary | ICD-10-CM | POA: Insufficient documentation

## 2016-11-03 DIAGNOSIS — Z5321 Procedure and treatment not carried out due to patient leaving prior to being seen by health care provider: Secondary | ICD-10-CM | POA: Insufficient documentation

## 2016-11-03 NOTE — ED Notes (Signed)
Pt told registration he was leaving to pick his child up at daycare

## 2016-11-03 NOTE — ED Triage Notes (Signed)
Pt complaint of productive cough with green sputum and nasal congestion/drainage onset Monday morning.

## 2016-11-04 ENCOUNTER — Encounter (HOSPITAL_COMMUNITY): Payer: Self-pay | Admitting: Emergency Medicine

## 2016-11-04 ENCOUNTER — Emergency Department (HOSPITAL_COMMUNITY): Payer: Self-pay

## 2016-11-04 ENCOUNTER — Emergency Department (HOSPITAL_COMMUNITY)
Admission: EM | Admit: 2016-11-04 | Discharge: 2016-11-04 | Disposition: A | Discharge status: Home / Self Care | Payer: Self-pay | Attending: Emergency Medicine | Admitting: Emergency Medicine

## 2016-11-04 DIAGNOSIS — J209 Acute bronchitis, unspecified: Secondary | ICD-10-CM | POA: Insufficient documentation

## 2016-11-04 DIAGNOSIS — F17200 Nicotine dependence, unspecified, uncomplicated: Secondary | ICD-10-CM

## 2016-11-04 DIAGNOSIS — F17210 Nicotine dependence, cigarettes, uncomplicated: Secondary | ICD-10-CM | POA: Insufficient documentation

## 2016-11-04 MED ORDER — ALBUTEROL SULFATE HFA 108 (90 BASE) MCG/ACT IN AERS
2.0000 | INHALATION_SPRAY | Freq: Once | RESPIRATORY_TRACT | Status: AC
Start: 2016-11-04 — End: 2016-11-04
  Administered 2016-11-04: 2 via RESPIRATORY_TRACT
  Filled 2016-11-04: qty 6.7

## 2016-11-04 MED ORDER — BENZONATATE 100 MG PO CAPS: 100.0000 mg | ORAL_CAPSULE | Freq: Three times a day (TID) | ORAL | 0 refills | Status: DC

## 2016-11-04 MED ORDER — BENZONATATE 100 MG PO CAPS
200.0000 mg | ORAL_CAPSULE | Freq: Once | ORAL | Status: AC
  Administered 2016-11-04: 200 mg via ORAL
  Filled 2016-11-04: qty 2

## 2016-11-04 NOTE — ED Provider Notes (Signed)
[facility]-EMERGENCY DEPT Provider Note   CSN: [id number] Arrival date & time: 11/04/16  0532     History   Chief Complaint Chief Complaint  Patient presents with  . Emesis  . Cough    HPI   Blood pressure (!) 147/79, pulse 64, temperature 98.3 F (36.8 C), temperature source Oral, resp. rate 16, height 5\' 10"  (1.778 m), weight 79.4 kg (175 lb), SpO2 99 %.  Geoffrey Flowers is a 31 y.o. male with past medical history significant for opioid overdose, aspiration pneumonia complaining of productive cough with blood-tinged sputum onset 2 days ago. He denies any fevers, chills, shortness of breath, chest pain, wheezing, sick contacts, rhinorrhea, abdominal pain, change in bowel or bladder habits. He has an associated episode of posttussive emesis this a.m. with a mild global headache. No medication taken prior to arrival. He also denies any history of DVT/PE, recent immobilizations, calf pain, leg swelling.  History reviewed. No pertinent past medical history.  Patient Active Problem List   Diagnosis Date Noted  . Acute respiratory failure with hypoxia (HCC) 06/08/2015  . Substance abuse 06/06/2015  . Opioid overdose 06/06/2015  . Borderline diabetes 06/06/2015  . Drug overdose 06/06/2015  . Acute renal failure (HCC)   . Aspiration pneumonia (HCC) 06/05/2015    History reviewed. No pertinent surgical history.     Home Medications    Prior to Admission medications   Medication Sig Start Date End Date Taking? Authorizing Provider  amoxicillin-clavulanate (AUGMENTIN) 875-125 MG tablet Take 1 tablet by mouth 2 (two) times daily. 06/08/15   Renae FickleShort, Mackenzie, MD  benzonatate (TESSALON) 100 MG capsule Take 1 capsule (100 mg total) by mouth every 8 (eight) hours. 11/04/16   Haniyyah Sakuma, Joni ReiningNicole, PA-C  Naloxone HCl 4 MG/0.1ML LIQD Place 0.1 mLs into the nose once as needed (drug overdose). May repeat dose in 2 to 3 minutes if no response 06/08/15   Renae FickleShort, Mackenzie, MD    Family  History Family History  Problem Relation Age of Onset  . Hypertension Mother     Social History Social History  Substance Use Topics  . Smoking status: Current Every Day Smoker  . Smokeless tobacco: Never Used  . Alcohol use Yes     Comment: states "some'     Allergies   Patient has no known allergies.   Review of Systems Review of Systems  A complete review of systems was obtained and all systems are negative except as noted in the HPI and PMH.    Physical Exam Updated Vital Signs BP (!) 147/79 (BP Location: Left Arm)   Pulse 64   Temp 97.8 F (36.6 C) (Oral)   Resp 16   Ht 5\' 10"  (1.778 m)   Wt 79.4 kg (175 lb)   SpO2 99%   BMI 25.11 kg/m   Physical Exam  Constitutional: He is oriented to person, place, and time. He appears well-developed and well-nourished.  HENT:  Head: Normocephalic and atraumatic.  Right Ear: External ear normal.  Left Ear: External ear normal.  Mouth/Throat: Oropharynx is clear and moist. No oropharyngeal exudate.  No drooling or stridor. Posterior pharynx mildly erythematous no significant tonsillar hypertrophy. No exudate. Soft palate rises symmetrically. No TTP or induration under tongue.   No tenderness to palpation of frontal or bilateral maxillary sinuses.  Mild mucosal edema in the nares with scant rhinorrhea.  Bilateral tympanic membranes with normal architecture and good light reflex.    Eyes: Pupils are equal, round, and reactive to light. Conjunctivae and  EOM are normal.  No TTP of maxillary or frontal sinuses  No TTP or induration of temporal arteries bilaterally  Neck: Normal range of motion. Neck supple.  FROM to C-spine. Pt can touch chin to chest without discomfort. No TTP of midline cervical spine.   Cardiovascular: Normal rate, regular rhythm and intact distal pulses.   Pulmonary/Chest: Effort normal and breath sounds normal. No stridor. No respiratory distress. He has no wheezes. He has no rales. He exhibits no  tenderness.  Abdominal: Soft. Bowel sounds are normal. There is no tenderness. There is no rebound and no guarding.  Musculoskeletal: Normal range of motion. He exhibits no edema or tenderness.  Neurological: He is alert and oriented to person, place, and time. No cranial nerve deficit.  II-Visual fields grossly intact. III/IV/VI-Extraocular movements intact.  Pupils reactive bilaterally. V/VII-Smile symmetric, equal eyebrow raise,  facial sensation intact VIII- Hearing grossly intact IX/X-Normal gag XI-bilateral shoulder shrug XII-midline tongue extension Motor: 5/5 bilaterally with normal tone and bulk Cerebellar: Normal finger-to-nose  and normal heel-to-shin test.   Romberg negative Ambulates with a coordinated gait   Nursing note and vitals reviewed.    ED Treatments / Results  Labs (all labs ordered are listed, but only abnormal results are displayed) Labs Reviewed - No data to display  EKG  EKG Interpretation None       Radiology Dg Chest 2 View  Result Date: 11/04/2016 CLINICAL DATA:  31 year old male with cough. EXAM: CHEST  2 VIEW COMPARISON:  Chest radiograph dated 06/05/2015 FINDINGS: The heart size and mediastinal contours are within normal limits. Both lungs are clear. The visualized skeletal structures are unremarkable. IMPRESSION: No active cardiopulmonary disease. Electronically Signed   By: Elgie Collard M.D.   On: 11/04/2016 06:51    Procedures Procedures (including critical care time)  Medications Ordered in ED Medications  albuterol (PROVENTIL HFA;VENTOLIN HFA) 108 (90 Base) MCG/ACT inhaler 2 puff (not administered)  benzonatate (TESSALON) capsule 200 mg (not administered)     Initial Impression / Assessment and Plan / ED Course  I have reviewed the triage vital signs and the nursing notes.  Pertinent labs & imaging results that were available during my care of the patient were reviewed by me and considered in my medical decision making (see  chart for details).     Vitals:   11/04/16 0539 11/04/16 0546 11/04/16 0626 11/04/16 0648  BP: (!) 147/79     Pulse: 64     Resp: 16     Temp: (!) 97.5 F (36.4 C)  98.3 F (36.8 C) 97.8 F (36.6 C)  TempSrc: Oral  Oral Oral  SpO2: 99%     Weight:  79.4 kg (175 lb)    Height:  5\' 10"  (1.778 m)      Medications  albuterol (PROVENTIL HFA;VENTOLIN HFA) 108 (90 Base) MCG/ACT inhaler 2 puff (not administered)  benzonatate (TESSALON) capsule 200 mg (not administered)    Geoffrey Flowers is 31 y.o. male presenting with Productive cough with blood-tinged sputum, posttussive emesis and a double headache. Neurologic exam nonfocal, patient afebrile, saturating well on room air with no tachypnea or tachycardia. He does have a history of aspiration pneumonia but that may be secondary to his prior drug overdose. Lung sounds are clear to auscultation, will obtain chest x-ray  Chest x-ray without infiltrate. We'll treat for a acute bronchitis with Tessalon Perles and albuterol inhaler.  Counseled patient on smoking cessation for greater than 10 minutes.  Evaluation does not show pathology that  would require ongoing emergent intervention or inpatient treatment. Pt is hemodynamically stable and mentating appropriately. Discussed findings and plan with patient/guardian, who agrees with care plan. All questions answered. Return precautions discussed and outpatient follow up given.    Final Clinical Impressions(s) / ED Diagnoses   Final diagnoses:  Acute bronchitis, unspecified organism  Tobacco use disorder    New Prescriptions New Prescriptions   BENZONATATE (TESSALON) 100 MG CAPSULE    Take 1 capsule (100 mg total) by mouth every 8 (eight) hours.     Kaylyn Lim 11/04/16 0654    Dione Booze, MD 11/04/16 2253

## 2016-11-04 NOTE — ED Notes (Addendum)
Pt states that he has been having N/V x 2 days. Pt states that he has vomited 2 x in the past 24 hours. Pt states that he had noticed blood in his emesis. Pt reports that he has had a HA as well and worsens with coughing, and has been coughing of mucus and has been  Green and  blood tinged.

## 2016-11-04 NOTE — ED Triage Notes (Signed)
Patient complaining of having a productive cough that started three days ago. Patient states that it was some blood in his fleam. Patient states that he was coughing so hard he vomited. Patient also is complaining of headache that started about an hour ago.

## 2016-11-04 NOTE — Discharge Instructions (Signed)
Please follow with your primary care doctor in the next 2 days for a check-up. They must obtain records for further management.  ° °Do not hesitate to return to the Emergency Department for any new, worsening or concerning symptoms.  ° °

## 2017-01-16 ENCOUNTER — Emergency Department (HOSPITAL_COMMUNITY)
Admission: EM | Admit: 2017-01-16 | Discharge: 2017-01-16 | Disposition: A | Discharge status: Home / Self Care | Payer: No Typology Code available for payment source

## 2017-01-16 NOTE — ED Notes (Signed)
Attempted to call patient to be triaged.

## 2017-01-16 NOTE — ED Notes (Signed)
Attempted to call for triage with no answer  

## 2017-07-26 IMAGING — US US RENAL
1 series · 14 of 25 positions shown · non-contrast
Comparison: None.

CLINICAL DATA: Acute onset of renal failure.  Initial encounter.

EXAM:
RENAL / URINARY TRACT ULTRASOUND COMPLETE

[Series 1: us renal · 0.22mm/px · 14 of 27 slices shown]
[im 1/27]
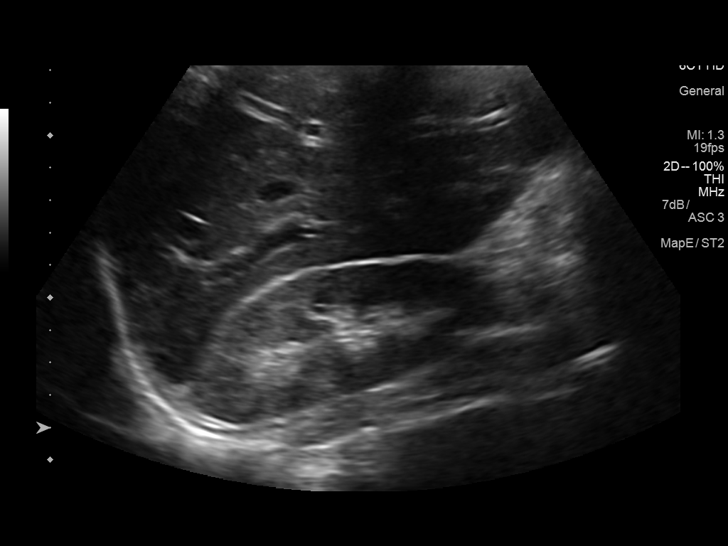
[im 3/27]
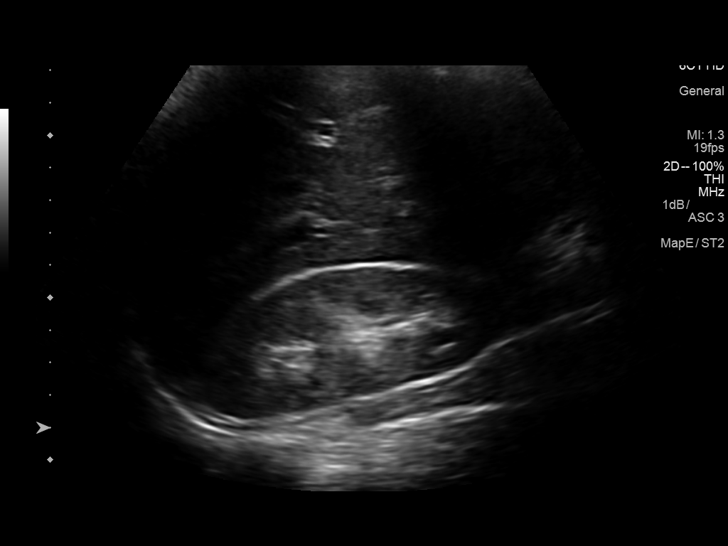
[im 5/27]
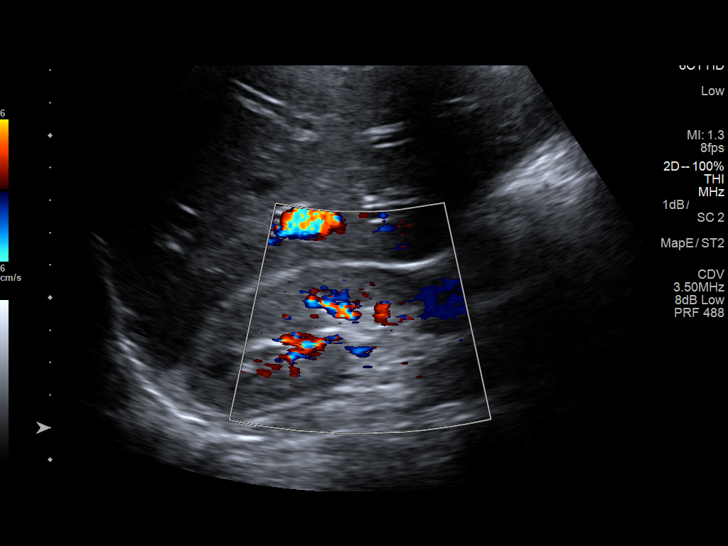
[im 7/27]
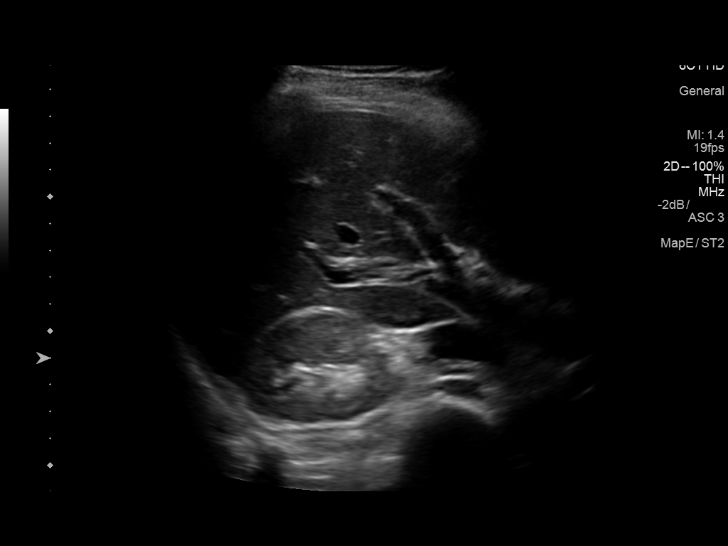
[im 9/27]
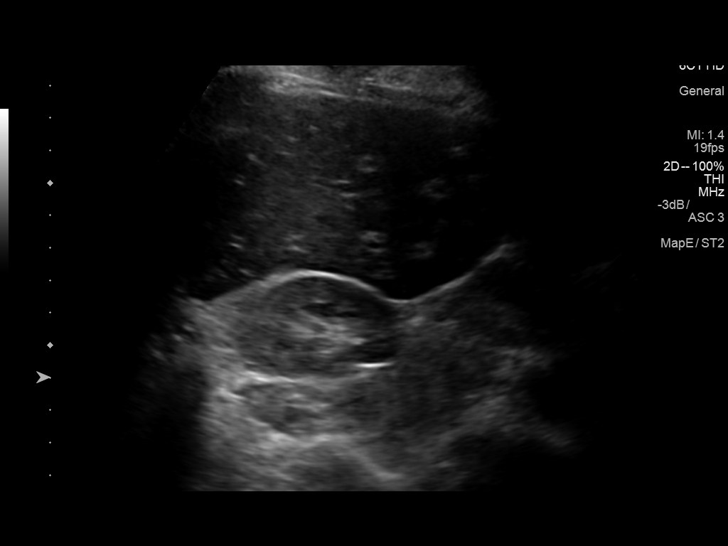
[im 10/27]
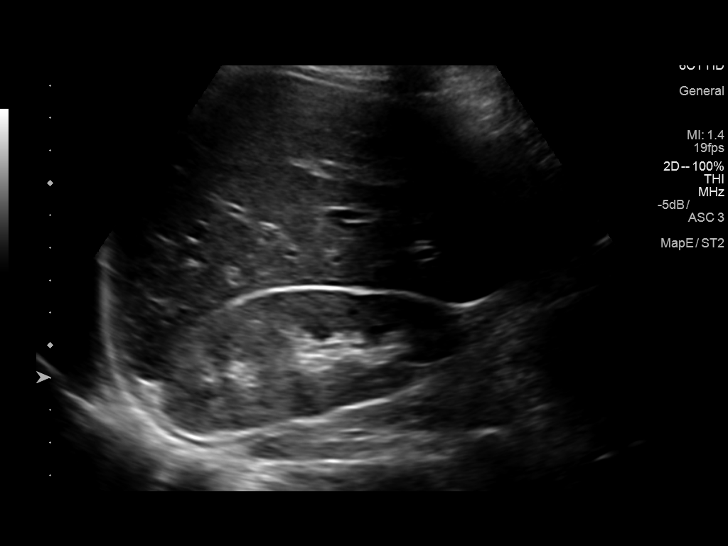
[im 12/27]
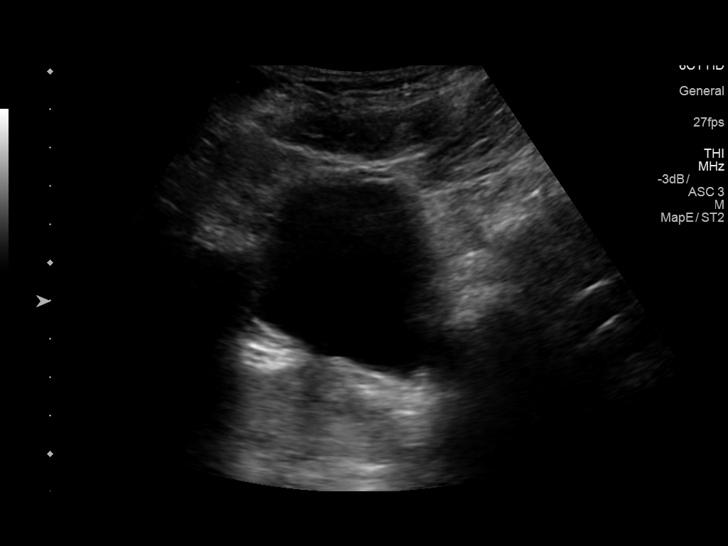
[im 15/27]
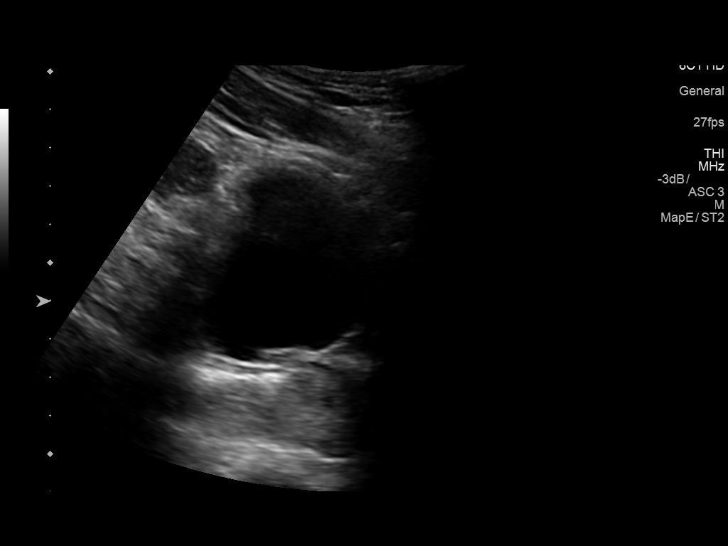
[im 17/27]
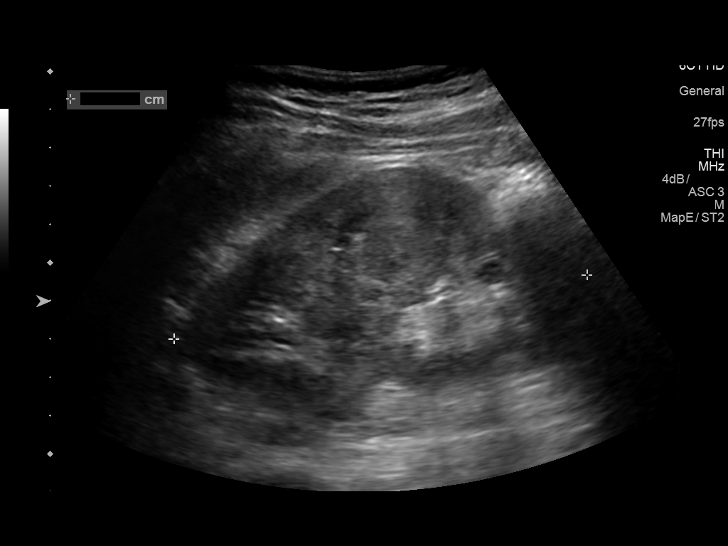
[im 18/27]
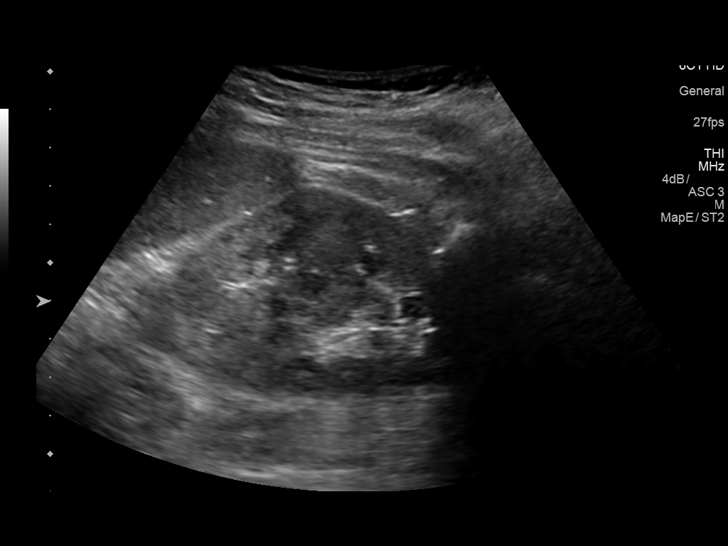
[im 20/27]
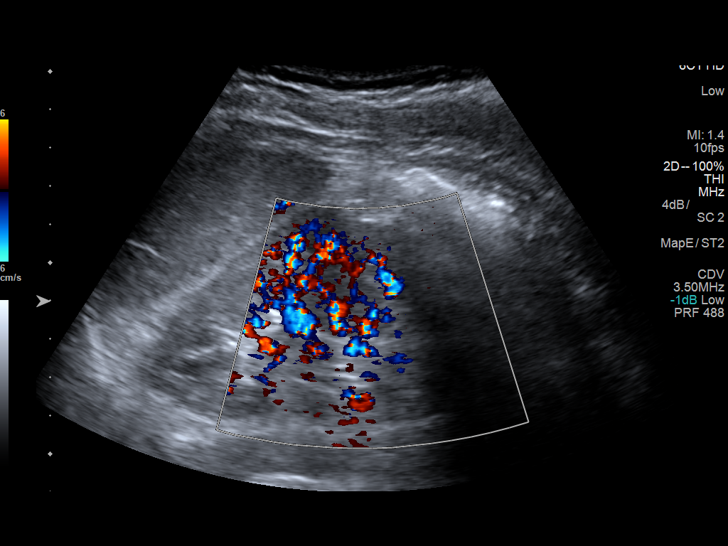
[im 22/27]
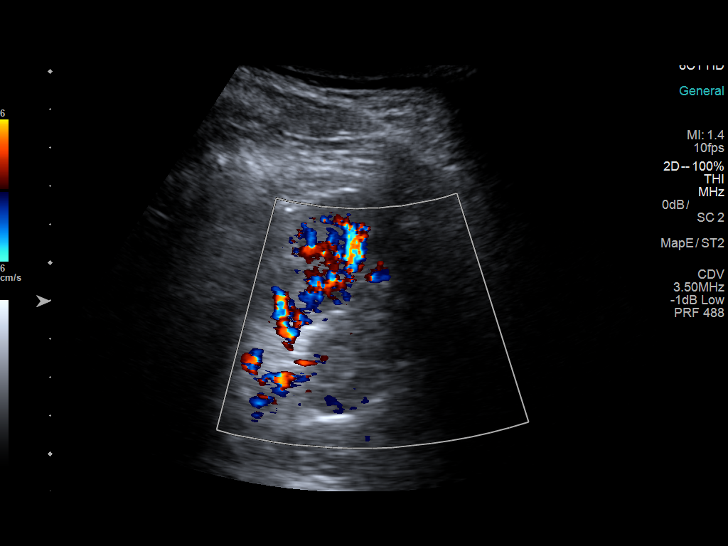
[im 24/27]
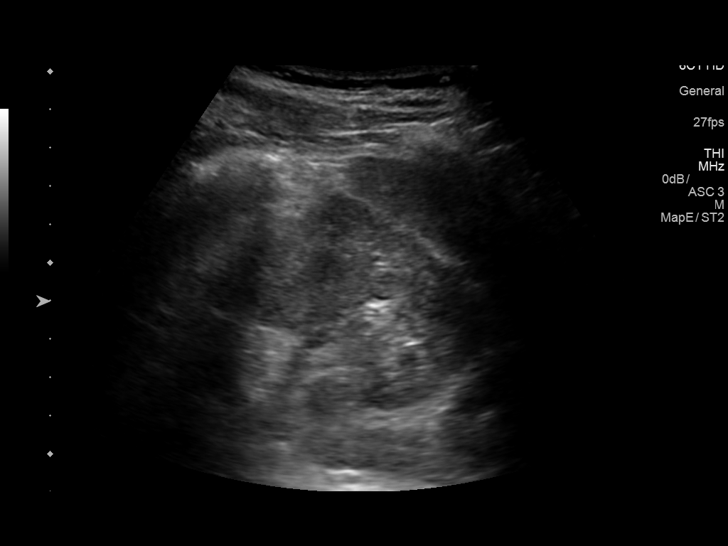
[im 27/27]
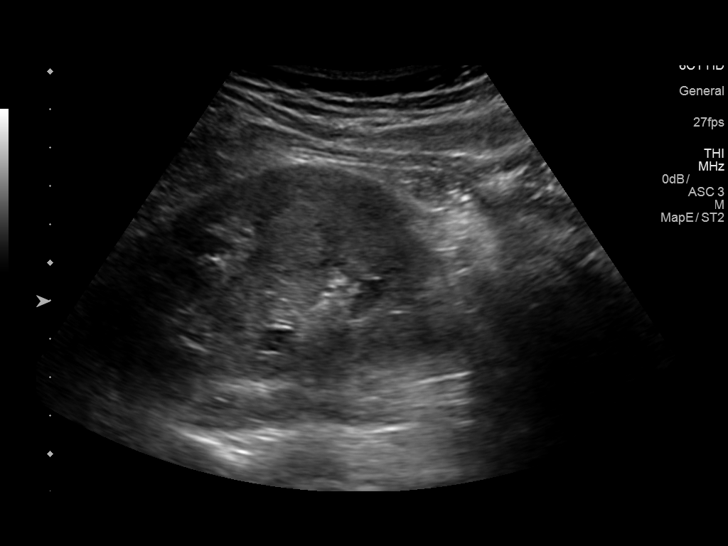

[14 of 25 positions shown; findings below may reference images not displayed]

FINDINGS: Right Kidney:

Length: 10.4 cm. Mildly increased parenchymal echogenicity
suggested. No mass or hydronephrosis visualized.

Left Kidney:

Length: 10.9 cm. Mildly increased parenchymal echogenicity
suggested. No mass or hydronephrosis visualized.

Bladder:

Appears normal for degree of bladder distention.
IMPRESSION: Suggestion of mildly increased bilateral renal parenchymal
echogenicity, which may reflect medical renal disease. No evidence
of hydronephrosis.

## 2017-11-14 ENCOUNTER — Emergency Department (HOSPITAL_COMMUNITY)
Admission: EM | Admit: 2017-11-14 | Discharge: 2017-11-15 | Disposition: A | Discharge status: Home / Self Care | Payer: Self-pay | Attending: Emergency Medicine | Admitting: Emergency Medicine

## 2017-11-14 ENCOUNTER — Other Ambulatory Visit: Payer: Self-pay

## 2017-11-14 ENCOUNTER — Encounter (HOSPITAL_COMMUNITY): Payer: Self-pay

## 2017-11-14 DIAGNOSIS — R45850 Homicidal ideations: Secondary | ICD-10-CM | POA: Insufficient documentation

## 2017-11-14 DIAGNOSIS — F322 Major depressive disorder, single episode, severe without psychotic features: Secondary | ICD-10-CM | POA: Insufficient documentation

## 2017-11-14 DIAGNOSIS — F1210 Cannabis abuse, uncomplicated: Secondary | ICD-10-CM | POA: Insufficient documentation

## 2017-11-14 DIAGNOSIS — F1120 Opioid dependence, uncomplicated: Secondary | ICD-10-CM | POA: Insufficient documentation

## 2017-11-14 DIAGNOSIS — F17210 Nicotine dependence, cigarettes, uncomplicated: Secondary | ICD-10-CM | POA: Insufficient documentation

## 2017-11-14 DIAGNOSIS — R45851 Suicidal ideations: Secondary | ICD-10-CM | POA: Insufficient documentation

## 2017-11-14 DIAGNOSIS — F1410 Cocaine abuse, uncomplicated: Secondary | ICD-10-CM | POA: Insufficient documentation

## 2017-11-14 DIAGNOSIS — F1414 Cocaine abuse with cocaine-induced mood disorder: Secondary | ICD-10-CM | POA: Diagnosis present

## 2017-11-14 LAB — COMPREHENSIVE METABOLIC PANEL
ALBUMIN: 4.7 g/dL (ref 3.5–5.0)
ALT: 12 U/L (ref 0–44)
ANION GAP: 9 (ref 5–15)
AST: 16 U/L (ref 15–41)
Alkaline Phosphatase: 93 U/L (ref 38–126)
BUN: 11 mg/dL (ref 6–20)
CHLORIDE: 110 mmol/L (ref 98–111)
CO2: 27 mmol/L (ref 22–32)
Calcium: 9.8 mg/dL (ref 8.9–10.3)
Creatinine, Ser: 1.23 mg/dL (ref 0.61–1.24)
GFR calc non Af Amer: >60 mL/min (ref >60)
GLUCOSE: 97 mg/dL (ref 70–99)
Potassium: 4.1 mmol/L (ref 3.5–5.1)
SODIUM: 146 mmol/L — AB (ref 135–145)
Total Bilirubin: 0.4 mg/dL (ref 0.3–1.2)
Total Protein: 7.8 g/dL (ref 6.5–8.1)

## 2017-11-14 LAB — RAPID URINE DRUG SCREEN, HOSP PERFORMED
Amphetamines: NOT DETECTED
Barbiturates: NOT DETECTED
Benzodiazepines: NOT DETECTED
COCAINE: POSITIVE — AB
OPIATES: NOT DETECTED
Tetrahydrocannabinol: POSITIVE — AB

## 2017-11-14 LAB — ETHANOL: Alcohol, Ethyl (B): <10 mg/dL (ref <10)

## 2017-11-14 LAB — CBC
HCT: 44.7 % (ref 39.0–52.0)
HEMOGLOBIN: 15.5 g/dL (ref 13.0–17.0)
MCH: 32.8 pg (ref 26.0–34.0)
MCHC: 34.7 g/dL (ref 30.0–36.0)
MCV: 94.7 fL (ref 78.0–100.0)
Platelets: 264 10*3/uL (ref 150–400)
RBC: 4.72 MIL/uL (ref 4.22–5.81)
RDW: 13.8 % (ref 11.5–15.5)
WBC: 7.8 10*3/uL (ref 4.0–10.5)

## 2017-11-14 LAB — SALICYLATE LEVEL: Salicylate Lvl: <7 mg/dL (ref 2.8–30.0)

## 2017-11-14 LAB — ACETAMINOPHEN LEVEL

## 2017-11-14 MED ORDER — TRAZODONE HCL 50 MG PO TABS
50.0000 mg | ORAL_TABLET | Freq: Once | ORAL | Status: AC
  Administered 2017-11-14: 50 mg via ORAL
  Filled 2017-11-14: qty 1

## 2017-11-14 MED ORDER — LORAZEPAM 2 MG/ML IJ SOLN: 0.0000 mg | Freq: Two times a day (BID) | INTRAMUSCULAR | Status: DC

## 2017-11-14 MED ORDER — THIAMINE HCL 100 MG/ML IJ SOLN: 100.0000 mg | Freq: Every day | INTRAMUSCULAR | Status: DC

## 2017-11-14 MED ORDER — VITAMIN B-1 100 MG PO TABS
100.0000 mg | ORAL_TABLET | Freq: Every day | ORAL | Status: DC
  Administered 2017-11-14: 100 mg via ORAL
  Filled 2017-11-14: qty 1

## 2017-11-14 MED ORDER — LORAZEPAM 1 MG PO TABS: 0.0000 mg | ORAL_TABLET | Freq: Two times a day (BID) | ORAL | Status: DC

## 2017-11-14 MED ORDER — LORAZEPAM 1 MG PO TABS
0.0000 mg | ORAL_TABLET | Freq: Four times a day (QID) | ORAL | Status: DC
  Administered 2017-11-14: 1 mg via ORAL
  Filled 2017-11-14: qty 1

## 2017-11-14 MED ORDER — IBUPROFEN 200 MG PO TABS: 600.0000 mg | ORAL_TABLET | Freq: Three times a day (TID) | ORAL | Status: DC | PRN

## 2017-11-14 MED ORDER — LORAZEPAM 2 MG/ML IJ SOLN: 0.0000 mg | Freq: Four times a day (QID) | INTRAMUSCULAR | Status: DC

## 2017-11-14 NOTE — ED Notes (Signed)
On admission to the Acute Unit pt is calm and cooperative. Affect flat, mood depressed. Does not appear to be responding to internal stimuli.  Given meal tray and ordered medications. Oriented to the unit.

## 2017-11-14 NOTE — ED Notes (Signed)
Bed: [facility]34 Expected date:  Expected time:  Means of arrival:  Comments: Hold for room 28 

## 2017-11-14 NOTE — ED Provider Notes (Signed)
[facility]-EMERGENCY DEPT Provider Note   CSN: [id number] Arrival date & time: 11/14/17  1617     History   Chief Complaint Chief Complaint  Patient presents with  . Suicidal  . Homicidal    HPI Geoffrey Flowers is a 32 y.o. male who presents today for evaluation of suicidal and homicidal ideations.  He reports that over the past few weeks he has been having increasing conflicts with his family.  He reports that he lost his job which is caused him to be homeless.  He says that his family is not willing to let him stay with them and he had to sleep outside last night.  He reports that he primarily has HI towards a client that he did not name and some cousins.  He reports that they have been making him feel like he is not part of the family and like he is a black sheep.  He reports that He has thought about how he would kill him self and kill them.  He has access to a gun and would use it if he wanted to.  He reports that the main reason he has not is because in the back of his mind he keeps remembering that he is on parole.  He is on parole from gun/drug charges.  He reports that he will use drugs if offered but doesn't have money to go buy them.  He reports using narcoitc pills, cocaine, THC, and alcohol.  He reports that he does not regularly use any one specific drug as he is unable to buy them and just gets what is offered to him.  He reports that a few years ago he did try to kill himself.  He tried to hang himself and states "that did not work out."  He also says that he has previously tried to "take a bunch of pills" to end his life.  He reports that he does not have visual hallucinations.  He does report that when everything else is quiet he will have auditory hallucinations.  HPI  History reviewed. No pertinent past medical history.  Patient Active Problem List   Diagnosis Date Noted  . Acute respiratory failure with hypoxia (HCC) 06/08/2015  . Substance  abuse (HCC) 06/06/2015  . Opioid overdose (HCC) 06/06/2015  . Borderline diabetes 06/06/2015  . Drug overdose 06/06/2015  . Acute renal failure (HCC)   . Aspiration pneumonia (HCC) 06/05/2015    History reviewed. No pertinent surgical history.      Home Medications    Prior to Admission medications   Medication Sig Start Date End Date Taking? Authorizing Provider  acetaminophen (TYLENOL) 500 MG tablet Take 1,500 mg by mouth daily as needed for headache.   Yes [provider]  amoxicillin-clavulanate (AUGMENTIN) 875-125 MG tablet Take 1 tablet by mouth 2 (two) times daily. Patient not taking: Reported on 11/14/2017 06/08/15   Renae Fickle, MD  benzonatate (TESSALON) 100 MG capsule Take 1 capsule (100 mg total) by mouth every 8 (eight) hours. Patient not taking: Reported on 11/14/2017 11/04/16   Pisciotta, Joni Reining, PA-C  Naloxone HCl 4 MG/0.1ML LIQD Place 0.1 mLs into the nose once as needed (drug overdose). May repeat dose in 2 to 3 minutes if no response Patient not taking: Reported on 11/14/2017 06/08/15   Renae Fickle, MD    Family History Family History  Problem Relation Age of Onset  . Hypertension Mother     Social History Social History   Tobacco Use  .  Smoking status: Current Every Day Smoker    Packs/day: 0.50  . Smokeless tobacco: Never Used  Substance Use Topics  . Alcohol use: Yes    Comment: states "some' 2 shots  . Drug use: Yes    Types: Heroin, Cocaine, Marijuana    Comment: uses THC daily, Percocets 2 x wk and Cocaine 1 x week     Allergies   Patient has no known allergies.   Review of Systems Review of Systems  Constitutional: Negative for chills and fever.  HENT: Negative for congestion.   Eyes: Negative for visual disturbance.  Respiratory: Negative for chest tightness and shortness of breath.   Cardiovascular: Negative for chest pain.  Gastrointestinal: Negative for abdominal pain, nausea and vomiting.  Genitourinary: Negative  for dysuria.  Musculoskeletal: Negative for neck pain.  Skin: Negative for color change.  Neurological: Negative for headaches.  Psychiatric/Behavioral: Positive for dysphoric mood and suicidal ideas. Negative for confusion and self-injury.  All other systems reviewed and are negative.    Physical Exam Updated Vital Signs BP 115/76 (BP Location: Right Arm)   Pulse 65   Temp 98.2 F (36.8 C)   Resp 16   Ht 5\' 11"  (1.803 m)   Wt 79.4 kg (175 lb)   SpO2 99%   BMI 24.41 kg/m   Physical Exam  Constitutional: He is oriented to person, place, and time. He appears well-developed and well-nourished. No distress.  HENT:  Head: Normocephalic and atraumatic.  Eyes: Conjunctivae are normal. Right eye exhibits no discharge. Left eye exhibits no discharge. No scleral icterus.  Neck: Normal range of motion.  Cardiovascular: Normal rate and regular rhythm.  Pulmonary/Chest: Effort normal. No stridor. No respiratory distress.  Abdominal: He exhibits no distension.  Musculoskeletal: He exhibits no edema or deformity.  Neurological: He is alert and oriented to person, place, and time. He exhibits normal muscle tone.  Skin: Skin is warm and dry. He is not diaphoretic.  Psychiatric: His speech is normal. His affect is angry and blunt. He is withdrawn and actively hallucinating. He expresses homicidal and suicidal ideation. He expresses suicidal plans and homicidal plans.  Poor eye contact He is inattentive.  Nursing note and vitals reviewed.    ED Treatments / Results  Labs (all labs ordered are listed, but only abnormal results are displayed) Labs Reviewed  COMPREHENSIVE METABOLIC PANEL - Abnormal; Notable for the following components:      Result Value   Sodium 146 (*)    All other components within normal limits  ACETAMINOPHEN LEVEL - Abnormal; Notable for the following components:   Acetaminophen (Tylenol), Serum <10 (*)    All other components within normal limits  RAPID URINE DRUG  SCREEN, HOSP PERFORMED - Abnormal; Notable for the following components:   Cocaine POSITIVE (*)    Tetrahydrocannabinol POSITIVE (*)    All other components within normal limits  ETHANOL  SALICYLATE LEVEL  CBC    EKG None  Radiology No results found.  Procedures Procedures (including critical care time)  Medications Ordered in ED Medications  LORazepam (ATIVAN) injection 0-4 mg (0 mg Intravenous Not Given 11/14/17 1819)    Or  LORazepam (ATIVAN) tablet 0-4 mg ( Oral See Alternative 11/14/17 1819)  LORazepam (ATIVAN) injection 0-4 mg (has no administration in time range)    Or  LORazepam (ATIVAN) tablet 0-4 mg (has no administration in time range)  thiamine (VITAMIN B-1) tablet 100 mg (100 mg Oral Given 11/14/17 1855)    Or  thiamine (B-1)  injection 100 mg ( Intravenous See Alternative 11/14/17 1855)  ibuprofen (ADVIL,MOTRIN) tablet 600 mg (has no administration in time range)  traZODone (DESYREL) tablet 50 mg (50 mg Oral Given 11/14/17 2149)     Initial Impression / Assessment and Plan / ED Course  I have reviewed the triage vital signs and the nursing notes.  Pertinent labs & imaging results that were available during my care of the patient were reviewed by me and considered in my medical decision making (see chart for details).    Patient presents today for evaluation of suicidal and homicidal ideations.  He reports that he recently became homeless after losing his job and that his family has been making him feel like an outcome asked, not allowing him to sleep inside their home, and generally being mean to him.  He reports  Access to a gun and that he has thought about murder-suicide.  He denies consistent drug use, stating he does not have the money to buy it and that if he is offered drugs he will take them.  Patient is currently here voluntarily, however if he attempts to leave prior to evaluation would recommend IVC given his suicidal, and homicidal ideations along with  access to a handgun.  She is medically clear for psychiatric disposition.  Final Clinical Impressions(s) / ED Diagnoses   Final diagnoses:  Suicidal ideation  Homicidal ideation    ED Discharge Orders    None       Norman Clay 11/14/17 2246    Rolan Bucco, MD 11/14/17 2321

## 2017-11-14 NOTE — ED Triage Notes (Signed)
Pt states that he has had some family members treating him like he is not a part of the family, and he felt like he wanted to hurt them. Pt states that suicidal thoughts have crossed his mind a few times as well. Pt calm and cooperative in triage.
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
## 2017-11-14 NOTE — ED Notes (Signed)
BELONGINGS IN LOCKER 34. BLACK PANTS, TAN PANTS, BLUE SHIRT, RED SHIRT, BLACK HAT, WHITE SOCKS, FOLDER WITH CELL PHONE, CHARGER, AND VARIOUS PAPERWORK.

## 2017-11-14 NOTE — ED Notes (Signed)
Pt sleeping at present, no distress noted, calm & cooperative. Remains HI & SI, no AVH.  Monitoring for safety, Q 15 min checks in effect.

## 2017-11-14 NOTE — ED Notes (Signed)
TTS at bedside. 

## 2017-11-15 DIAGNOSIS — R45851 Suicidal ideations: Secondary | ICD-10-CM

## 2017-11-15 DIAGNOSIS — F1414 Cocaine abuse with cocaine-induced mood disorder: Secondary | ICD-10-CM

## 2017-11-15 DIAGNOSIS — R45850 Homicidal ideations: Secondary | ICD-10-CM
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
## 2017-11-15 NOTE — BHH Suicide Risk Assessment (Signed)
Suicide Risk Assessment  Discharge Assessment   [facility]   Principal Problem: Cocaine abuse with cocaine-induced mood disorder [facility](HCC) Discharge Diagnoses:  Patient Active Problem List   Diagnosis Date Noted  . Cocaine abuse with cocaine-induced mood disorder [facility](HCC) [F14.14] 11/15/2017    Priority: High  . Acute respiratory failure with hypoxia (HCC) [J96.01] 06/08/2015  . Substance abuse (HCC) [F19.10] 06/06/2015  . Opioid overdose (HCC) [T40.2X1A] 06/06/2015  . Borderline diabetes [R73.03] 06/06/2015  . Drug overdose [T50.901A] 06/06/2015  . Acute renal failure (HCC) [N17.9]   . Aspiration pneumonia (HCC) [J69.0] 06/05/2015    Total Time spent with patient: 45 minutes    Musculoskeletal: Strength & Muscle Tone: within normal limits Gait & Station: normal Patient leans: N/A  Psychiatric Specialty Exam: Physical Exam  Constitutional: He is oriented to person, place, and time. He appears well-developed and well-nourished.  HENT:  Head: Normocephalic.  Neck: Normal range of motion.  Respiratory: Effort normal.  Musculoskeletal: Normal range of motion.  Neurological: He is alert and oriented to person, place, and time.  Psychiatric: His speech is normal and behavior is normal. Judgment and thought content normal. His mood appears anxious. Cognition and memory are normal.    Review of Systems  Psychiatric/Behavioral: Positive for substance abuse. The patient is nervous/anxious.   All other systems reviewed and are negative.   Blood pressure 115/76, pulse 65, temperature 98.2 F (36.8 C), resp. rate 16, height 5\' 11"  (1.803 m), weight 79.4 kg (175 lb), SpO2 99 %.Body mass index is 24.41 kg/m.  General Appearance: Casual  Eye Contact:  Good  Speech:  Normal Rate  Volume:  Normal  Mood:  Anxious  Affect:  Congruent  Thought Process:  Coherent and Descriptions of Associations: Intact  Orientation:  Full (Time, Place, and Person)  Thought  Content:  WDL and Logical  Suicidal Thoughts:  No  Homicidal Thoughts:  No  Memory:  Immediate;   Good Recent;   Good Remote;   Good  Judgement:  Fair  Insight:  Fair  Psychomotor Activity:  Normal  Concentration:  Concentration: Good and Attention Span: Good  Recall:  Good  Fund of Knowledge:  Good  Language:  Good  Akathisia:  No  Handed:  Right  AIMS (if indicated):     Assets:  Leisure Time Physical Health Resilience Social Support  ADL's:  Intact  Cognition:  WNL  Sleep:       Mental Status Per Nursing Assessment::   On Admission:   cocaine abuse with suicidal ideations  Demographic Factors:  Male and Caucasian  Loss Factors: NA  Historical Factors: NA  Risk Reduction Factors:   Sense of responsibility to family, Living with another person, especially a relative, Positive social support and Positive therapeutic relationship  Continued Clinical Symptoms:  Anxiety, mild  Cognitive Features That Contribute To Risk:  None    Suicide Risk:  Minimal: No identifiable suicidal ideation.  Patients presenting with no risk factors but with morbid ruminations; may be classified as minimal risk based on the severity of the depressive symptoms    Plan Of Care/Follow-up recommendations:  Activity:  as tolerated Diet:  heart healthy diet  Zo Loudon, NP 11/15/2017, 11:24 AM

## 2017-11-15 NOTE — Discharge Instructions (Signed)
For your behavioral health needs you are advised to follow up with [facility].  New patients are seen at their walk-in clinic.  Walk-in hours are Monday - Friday from 8:00 am - 12:00 pm, and from 1:00 pm - 3:00 pm.  Walk-in patients are seen on a first come, first served basis, so try to arrive as early as possible for the best chance of being seen the same day.  There is an initial fee of $22.50: ° °     [facility] °     [address] °     [place], [place] [address] °     [phone] °

## 2017-11-15 NOTE — Patient Outreach (Signed)
ED Peer Support Specialist Patient Intake (Complete at intake & 30-60 Day Follow-up)  Name: Geoffrey Flowers  MRN: [id number]  Age: 31 y.o.   Date of Admission: 11/15/2017  Intake: Initial Comments:      Primary Reason Admitted: SI, HI, poly substance use with opioid, cocaine, alcohol, and marijuana use   Lab values: Alcohol/ETOH: Negative Positive UDS? Yes Amphetamines: No Barbiturates: No Benzodiazepines: No Cocaine: Yes Opiates: No Cannabinoids: Yes  Demographic information: Gender: Male Ethnicity: African American Marital Status: Single Insurance Status: Uninsured/Self-pay Receives non-medical governmental assistance ([employer]/Welfare, food stamps, etc.: Yes Lives with: Alone Living situation: Homeless  Reported Patient History: Patient reported health conditions: Depression Patient aware of HIV and hepatitis status: Yes (comment)  In past year, has patient visited ED for any reason? Yes  Number of ED visits: 1  Reason(s) for visit: cold  In past year, has patient been hospitalized for any reason? No  Number of hospitalizations:    Reason(s) for hospitalization:    In past year, has patient been arrested? No  Number of arrests:    Reason(s) for arrest:    In past year, has patient been incarcerated? No  Number of incarcerations:    Reason(s) for incarceration:    In past year, has patient received medication-assisted treatment? No  In past year, patient received the following treatments:    In past year, has patient received any harm reduction services? No  Did this include any of the following?    In past year, has patient received care from a mental health provider for diagnosis other than SUD? No  In past year, is this first time patient has overdosed? (has not overdosed in the past year )  Number of past overdoses:    In past year, is this first time patient has been hospitalized for an overdose? (has not overdosed in the past year)  Number of  hospitalizations for overdose(s):    Is patient currently receiving treatment for a mental health diagnosis? No  Patient reports experiencing difficulty participating in SUD treatment: No    Most important reason(s) for this difficulty?    Has patient received prior services for treatment? No  In past, patient has received services from following agencies:    Plan of Care:  Suggested follow up at these agencies/treatment centers: (Patient is interested in residential substance use treatment. [facility]PSS helped with these referrals and provided residential treatment information. )  Other information: CPSS met with the patient to provide substance use recovery support. CPSS called [facility] to see if they had any residential substance use treatment beds available and they informed CPSS to call back tomorrow. CPSS provided recovery resource information including residential substance use treatment center list, [facility], NA/AA meeting list for the [place] area, homeless shelter list, and CPSS contact information. CPSS explained informed regarding these resources in great detail. CPSS strongly encouraged the patient to follow up with [facility]PSS after discharge from the hospital for further help with these resources.    John Shinn, CPSS  11/15/2017 12:28 PM          

## 2017-11-15 NOTE — ED Notes (Signed)
Pt d/c home per MD order. Discharge summary reviewed with pt, pt verbalizes understanding. Denies SI/HI/AVH. Bus pass provided per pt request. Pt signed e-signature. Personal property returned. Pt ambulatory off unit with [facility]HT.

## 2017-11-15 NOTE — BH Assessment (Signed)
BHH Assessment Progress Note  Per Jacqueline Norman, DO, this pt does not require psychiatric hospitalization at this time.  Pt is to be discharged from [facility] with recommendation to follow up with [facility].  This has been included in pt's discharge instructions.  Pt would also benefit from seeing Peer Support Specialists; they will be asked to speak to pt.  Pt's nurse, Ashley, has been notified.  Courtenay Hirth, MA Triage Specialist [phone]     

## 2017-12-19 ENCOUNTER — Encounter (HOSPITAL_COMMUNITY): Payer: Self-pay

## 2017-12-19 ENCOUNTER — Other Ambulatory Visit: Payer: Self-pay

## 2017-12-19 ENCOUNTER — Other Ambulatory Visit: Payer: Self-pay | Admitting: Physician Assistant

## 2017-12-19 ENCOUNTER — Emergency Department (HOSPITAL_COMMUNITY)
Admission: EM | Admit: 2017-12-19 | Discharge: 2017-12-19 | Disposition: A | Discharge status: Home / Self Care | Payer: Self-pay | Attending: Emergency Medicine | Admitting: Emergency Medicine

## 2017-12-19 DIAGNOSIS — F17210 Nicotine dependence, cigarettes, uncomplicated: Secondary | ICD-10-CM | POA: Insufficient documentation

## 2017-12-19 DIAGNOSIS — T401X1A Poisoning by heroin, accidental (unintentional), initial encounter: Secondary | ICD-10-CM | POA: Insufficient documentation

## 2017-12-19 DIAGNOSIS — F1414 Cocaine abuse with cocaine-induced mood disorder: Secondary | ICD-10-CM | POA: Diagnosis present

## 2017-12-19 DIAGNOSIS — F1910 Other psychoactive substance abuse, uncomplicated: Secondary | ICD-10-CM

## 2017-12-19 DIAGNOSIS — I2489 Other forms of acute ischemic heart disease: Secondary | ICD-10-CM

## 2017-12-19 DIAGNOSIS — T50992A Poisoning by other drugs, medicaments and biological substances, intentional self-harm, initial encounter: Secondary | ICD-10-CM

## 2017-12-19 DIAGNOSIS — I4891 Unspecified atrial fibrillation: Secondary | ICD-10-CM

## 2017-12-19 DIAGNOSIS — R404 Transient alteration of awareness: Secondary | ICD-10-CM | POA: Insufficient documentation

## 2017-12-19 DIAGNOSIS — R7303 Prediabetes: Secondary | ICD-10-CM | POA: Diagnosis present

## 2017-12-19 DIAGNOSIS — I248 Other forms of acute ischemic heart disease: Secondary | ICD-10-CM

## 2017-12-19 DIAGNOSIS — T50901A Poisoning by unspecified drugs, medicaments and biological substances, accidental (unintentional), initial encounter: Secondary | ICD-10-CM | POA: Diagnosis present

## 2017-12-19 HISTORY — DX: Prediabetes: R73.03

## 2017-12-19 HISTORY — DX: Cocaine use, unspecified, uncomplicated: F14.90

## 2017-12-19 HISTORY — DX: Depression, unspecified: F32.A

## 2017-12-19 HISTORY — DX: Suicide attempt, initial encounter: T14.91XA

## 2017-12-19 HISTORY — DX: Opioid use, unspecified, uncomplicated: F11.90

## 2017-12-19 HISTORY — DX: Other psychoactive substance abuse, uncomplicated: F19.10

## 2017-12-19 HISTORY — DX: Major depressive disorder, single episode, unspecified: F32.9

## 2017-12-19 LAB — BASIC METABOLIC PANEL
Anion gap: 9 (ref 5–15)
BUN: 11 mg/dL (ref 6–20)
CHLORIDE: 102 mmol/L (ref 98–111)
CO2: 31 mmol/L (ref 22–32)
Calcium: 9.4 mg/dL (ref 8.9–10.3)
Creatinine, Ser: 1.05 mg/dL (ref 0.61–1.24)
GFR calc non Af Amer: >60 mL/min (ref >60)
Glucose, Bld: 143 mg/dL — ABNORMAL HIGH (ref 70–99)
POTASSIUM: 3.8 mmol/L (ref 3.5–5.1)
SODIUM: 142 mmol/L (ref 135–145)

## 2017-12-19 LAB — RAPID URINE DRUG SCREEN, HOSP PERFORMED
Amphetamines: NOT DETECTED
Barbiturates: NOT DETECTED
Benzodiazepines: NOT DETECTED
Cocaine: NOT DETECTED
Opiates: POSITIVE — AB
Tetrahydrocannabinol: POSITIVE — AB

## 2017-12-19 LAB — CBC
HCT: 44.6 % (ref 39.0–52.0)
HEMOGLOBIN: 14.9 g/dL (ref 13.0–17.0)
MCH: 32 pg (ref 26.0–34.0)
MCHC: 33.4 g/dL (ref 30.0–36.0)
MCV: 95.9 fL (ref 78.0–100.0)
Platelets: 207 10*3/uL (ref 150–400)
RBC: 4.65 MIL/uL (ref 4.22–5.81)
RDW: 13.7 % (ref 11.5–15.5)
WBC: 18.4 10*3/uL — AB (ref 4.0–10.5)

## 2017-12-19 LAB — HEPATIC FUNCTION PANEL
ALT: 18 U/L (ref 0–44)
AST: 22 U/L (ref 15–41)
Albumin: 4.3 g/dL (ref 3.5–5.0)
Alkaline Phosphatase: 77 U/L (ref 38–126)
BILIRUBIN INDIRECT: 0.2 mg/dL — AB (ref 0.3–0.9)
Bilirubin, Direct: 0.1 mg/dL (ref 0.0–0.2)
TOTAL PROTEIN: 7 g/dL (ref 6.5–8.1)
Total Bilirubin: 0.3 mg/dL (ref 0.3–1.2)

## 2017-12-19 LAB — LIPID PANEL
CHOL/HDL RATIO: 4.7 ratio
Cholesterol: 140 mg/dL (ref 0–200)
HDL: 30 mg/dL — AB (ref >40)
LDL CALC: 95 mg/dL (ref 0–99)
Triglycerides: 74 mg/dL (ref <150)
VLDL: 15 mg/dL (ref 0–40)

## 2017-12-19 LAB — TSH: TSH: 0.895 u[IU]/mL (ref 0.350–4.500)

## 2017-12-19 LAB — TROPONIN I: Troponin I: 0.12 ng/mL (ref <0.03)

## 2017-12-19 LAB — MAGNESIUM: MAGNESIUM: 2.2 mg/dL (ref 1.7–2.4)

## 2017-12-19 MED ORDER — DILTIAZEM HCL ER COATED BEADS 240 MG PO CP24: 240.0000 mg | ORAL_CAPSULE | Freq: Every day | ORAL | Status: DC

## 2017-12-19 MED ORDER — MIDAZOLAM HCL 2 MG/2ML IJ SOLN: 1.0000 mg | Freq: Once | INTRAMUSCULAR | Status: DC

## 2017-12-19 MED ORDER — ASPIRIN 81 MG PO CHEW
324.0000 mg | CHEWABLE_TABLET | Freq: Once | ORAL | Status: AC
  Administered 2017-12-19: 324 mg via ORAL
  Filled 2017-12-19: qty 4

## 2017-12-19 MED ORDER — DILTIAZEM HCL ER COATED BEADS 240 MG PO CP24: 240.0000 mg | ORAL_CAPSULE | Freq: Every day | ORAL | 2 refills | Status: DC

## 2017-12-19 MED ORDER — DILTIAZEM HCL 30 MG PO TABS
30.0000 mg | ORAL_TABLET | Freq: Once | ORAL | Status: AC
  Administered 2017-12-19: 30 mg via ORAL
  Filled 2017-12-19: qty 1

## 2017-12-19 NOTE — Progress Notes (Signed)
CSW aware of consult. Per notes, [facility]. Per notes, patient reported using cocaine and "snorting some roxy" yesterday. CSW spoke with patient at bedside regarding incident and attempted to offer resources. Patient stated he did not feel he needed the resources at this time as this was "a fluke" and "would not happen again". Patient denied needing any other resources. No further CSW needs at this time. Please reconsult if needs arise.  Archie BalboaMackenzie Irwin, LCSWA  Clinical Social Work Department  [facility]  [phone]

## 2017-12-19 NOTE — ED Triage Notes (Signed)
Pt presents to ED via EMS for overdose. Pt was sitting in his car unresponsive. Pt found apneic and given IN narcan with no response. Pt given 0.5 mg narcan IV with positive response. Pt arrives to ED awake and alert.

## 2017-12-19 NOTE — ED Notes (Signed)
After talking with pt, he admits to taking cocaine yesterday, and "snorting some roxy." Pt unaware of situation he was found. Pt admits that he was diagnosed with diabetes "a while ago," but has never taken medications for it.

## 2017-12-19 NOTE — ED Notes (Signed)
PT EATING ICE.  NO TEMP

## 2017-12-19 NOTE — Progress Notes (Addendum)
D/w Dr. Eden EmmsNishan. He initially suggested pt get OP 48 hr holter and echo but patient is uninsured and unlikely to afford these - Dr. Eden EmmsNishan therefore plans to put a quick echo probe on him when he returns for f/u in clinic. Pt was scheduled for f/u 12/27/17 with Vin Bhagat. Dr. Eden EmmsNishan is in clinic that AM and should be notified when patient is seen so he can do a quick bedside echo. Diltiazem CD 240mg  sent in, should get first dose in ED prior to DC. I'll route this note to Vin to make him aware. Maddi Collar PA-C   Addendum 12/19/2017 10:03 AM: prior to long-acting diltiazem being given, pt appears to have converted to NSR. Per Dr. Eden EmmsNishan, will not send out on oral diltiazem. Will keep f/u in the office as outlined above. 12 lead EKG f/u reviewed with Dr. Eden EmmsNishan who feels this is normal (early repol). I also notified [facility] not to fill rx. Pt and EDP updated of plan. Jaslyne Beeck PA-C

## 2017-12-19 NOTE — ED Notes (Signed)
Bed: RESB Expected date:  Expected time:  Means of arrival:  Comments: overdose 
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
## 2017-12-19 NOTE — ED Provider Notes (Addendum)
[name]-EMERGENCY DEPT Provider Note   CSN: [id number] Arrival date & time: 12/19/17  0544     History   Chief Complaint Chief Complaint  Patient presents with  . Drug Overdose    HPI Geoffrey Flowers is a 32 y.o. male.  HPI 32 year old male brought into the ER with chief complaint of overdose.  Patient has history of polysubstance abuse including cocaine and opiates.  According to PD, patient was found in a car passed out.  PD called EMS when they noted that patient was also apneic.  Patient was given intranasal Narcan without response, he was given 0.5 mg IV Narcan thereafter, and patient responded.  Patient is now alert and oriented x3.  He admits to snorting heroin and using roxy earlier today.  He also admits to cocaine use this weekend.  Patient denies any SI, HI.  He has no chest pain, shortness of breath, abdominal pain, headaches.  Past Medical History:  Diagnosis Date  . Cocaine use   . Depression   . Heroin use   . Polysubstance abuse (HCC)   . Pre-diabetes   . Suicide attempt [facility])     Patient Active Problem List   Diagnosis Date Noted  . Cocaine abuse with cocaine-induced mood disorder (HCC) 11/15/2017  . Acute respiratory failure with hypoxia (HCC) 06/08/2015  . Substance abuse (HCC) 06/06/2015  . Opioid overdose (HCC) 06/06/2015  . Borderline diabetes 06/06/2015  . Drug overdose 06/06/2015  . Acute renal failure (HCC)   . Aspiration pneumonia (HCC) 06/05/2015    History reviewed. No pertinent surgical history.      Home Medications    Prior to Admission medications   Not on File    Family History Family History  Problem Relation Age of Onset  . Hypertension Mother     Social History Social History   Tobacco Use  . Smoking status: Current Every Day Smoker    Packs/day: 0.50  . Smokeless tobacco: Never Used  Substance Use Topics  . Alcohol use: Yes    Comment: states "some' 2 shots  . Drug use: Yes   Types: Heroin, Cocaine, Marijuana    Comment: uses THC daily, Percocets 2 x wk and Cocaine 1 x week     Allergies   Patient has no known allergies.   Review of Systems Review of Systems  Constitutional: Positive for activity change.  Respiratory: Negative for shortness of breath.   Cardiovascular: Negative for chest pain.  Gastrointestinal: Negative for nausea and vomiting.  All other systems reviewed and are negative.    Physical Exam Updated Vital Signs BP 96/78   Pulse 88   Temp 97.8 F (36.6 C) (Oral)   Resp 17   Ht 5\' 10"  (1.778 m)   Wt 77.1 kg   SpO2 93%   BMI 24.39 kg/m   Physical Exam  Constitutional: He is oriented to person, place, and time. He appears well-developed.  HENT:  Head: Atraumatic.  Eyes: Pupils are equal, round, and reactive to light.  Pupils are 3 mm and equal.  Patient has horizontal nystagmus  Neck: Neck supple.  Cardiovascular: Normal rate.  Pulmonary/Chest: Effort normal.  Neurological: He is alert and oriented to person, place, and time.  Skin: Skin is warm.  Nursing note and vitals reviewed.    ED Treatments / Results  Labs (all labs ordered are listed, but only abnormal results are displayed) Labs Reviewed  BASIC METABOLIC PANEL - Abnormal; Notable for the following components:  Result Value   Glucose, Bld 143 (*)    All other components within normal limits  CBC - Abnormal; Notable for the following components:   WBC 18.4 (*)    All other components within normal limits  TROPONIN I - Abnormal; Notable for the following components:   Troponin I 0.12 (*)    All other components within normal limits  MAGNESIUM  RAPID URINE DRUG SCREEN, HOSP PERFORMED  TROPONIN I    EKG EKG Interpretation  Date/Time:  Tuesday December 19 2017 05:54:29 EDT Ventricular Rate:  140 PR Interval:    QRS Duration: 87 QT Interval:  304 QTC Calculation: 464 R Axis:   86 Text Interpretation:  Atrial fibrillation No old tracing to compare  normal intervals Confirmed by Derwood KaplanNanavati, Akif Weldy [phone]) on 12/19/2017 6:40:04 AM Also confirmed by Derwood KaplanNanavati, Aldahir Litaker [phone]), editor Barbette Hairassel, Kerry [phone]  on 12/19/2017 6:58:43 AM   Radiology No results found.  Procedures Procedures (including critical care time) CRITICAL CARE Performed by: Maxxon Schwanke   Total critical care time: 36 minutes  Critical care time was exclusive of separately billable procedures and treating other patients.  Critical care was necessary to treat or prevent imminent or life-threatening deterioration.  Critical care was time spent personally by me on the following activities: development of treatment plan with patient and/or surrogate as well as nursing, discussions with consultants, evaluation of patient's response to treatment, examination of patient, obtaining history from patient or surrogate, ordering and performing treatments and interventions, ordering and review of laboratory studies, ordering and review of radiographic studies, pulse oximetry and re-evaluation of patient's condition.     Medications Ordered in ED Medications  midazolam (VERSED) injection 1 mg (has no administration in time range)  diltiazem (CARDIZEM) tablet 30 mg (30 mg Oral Given 12/19/17 0725)  aspirin chewable tablet 324 mg (324 mg Oral Given 12/19/17 0820)     Initial Impression / Assessment and Plan / ED Course  I have reviewed the triage vital signs and the nursing notes.  Pertinent labs & imaging results that were available during my care of the patient were reviewed by me and considered in my medical decision making (see chart for details).  Clinical Course as of Dec 20 827  Tue Dec 19, 2017  [id number] EKG confirms our suspicion for A. Fib. Patient is in A. fib with RVR. This could be induced by his substance abuse.  [place] Vascor is 0.  Aspirin given. Basic labs have been ordered now. Might need admission.   [AN]  0815 Likely related to demand ischemia from his  tachydysrhythmia. She continues to be chest pain-free.  Troponin I(!!): 0.12 [AN]  [id number] Cardiology team to see the patient    [AN]    Clinical Course User Index [AN] Derwood KaplanNanavati, Iracema Lanagan, MD    32 year old male comes in after he was found unresponsive.  Patient was given intranasal and thereafter IV Narcan, and he responded.  It seems like patient was apneic when PD arrived.  Patient admits to heroin use, and also admits to cocaine use within the past 3 hours.  Patient is tachycardic.  He is AAO x3.  He has no symptoms of chest pain, shortness of breath or belly pain.  We will monitor him closely for now.  UDS and EKG ordered.  8:29 AM This patients CHA2DS2-VASc Score and unadjusted Ischemic Stroke Rate (% per year) is equal to 0.2 % stroke rate/year from a score of 0  Above score calculated as 1 point each if  present [CHF, HTN, DM, Vascular=MI/PAD/Aortic Plaque, Age if 17-74, or Male] Above score calculated as 2 points each if present [Age > 75, or Stroke/TIA/TE]    Final Clinical Impressions(s) / ED Diagnoses   Final diagnoses:  Accidental overdose of heroin, initial encounter [facility])  Polysubstance abuse [facility])    ED Discharge Orders         Ordered    Amb referral to [facility]     12/19/17 0640           Derwood Kaplan, MD 12/19/17 1610    Derwood Kaplan, MD 12/19/17 9604    Derwood Kaplan, MD 12/19/17 0830

## 2017-12-27 ENCOUNTER — Ambulatory Visit: Payer: Self-pay | Admitting: Physician Assistant

## 2017-12-28 ENCOUNTER — Ambulatory Visit: Payer: Self-pay | Admitting: Physician Assistant

## 2017-12-28 ENCOUNTER — Encounter: Payer: Self-pay | Admitting: Physician Assistant
